# Patient Record
Sex: Female | Born: 1995 | Race: White | Hispanic: No | Marital: Single | Smoking: Never smoker
Health system: Southern US, Community
[De-identification: ages and names within clinical notes are randomized; demographics above are authoritative.]

## PROBLEM LIST (undated history)

## (undated) DIAGNOSIS — F329 Major depressive disorder, single episode, unspecified: Secondary | ICD-10-CM

## (undated) DIAGNOSIS — F32A Depression, unspecified: Secondary | ICD-10-CM

## (undated) DIAGNOSIS — J45909 Unspecified asthma, uncomplicated: Secondary | ICD-10-CM

## (undated) DIAGNOSIS — D649 Anemia, unspecified: Secondary | ICD-10-CM

## (undated) DIAGNOSIS — F419 Anxiety disorder, unspecified: Secondary | ICD-10-CM

## (undated) HISTORY — PX: NO PAST SURGERIES: SHX2092

## (undated) HISTORY — PX: OVARY SURGERY: SHX727

---
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
## 2014-05-22 ENCOUNTER — Encounter (HOSPITAL_COMMUNITY): Payer: Self-pay | Admitting: Emergency Medicine

## 2014-05-22 ENCOUNTER — Emergency Department (HOSPITAL_COMMUNITY): Payer: Medicaid Other

## 2014-05-22 ENCOUNTER — Emergency Department (HOSPITAL_COMMUNITY)
Admission: EM | Admit: 2014-05-22 | Discharge: 2014-05-23 | Disposition: A | Discharge status: Home / Self Care | Payer: Medicaid Other | Attending: Emergency Medicine | Admitting: Emergency Medicine

## 2014-05-22 DIAGNOSIS — N8320 Unspecified ovarian cysts: Secondary | ICD-10-CM | POA: Diagnosis not present

## 2014-05-22 DIAGNOSIS — Z880 Allergy status to penicillin: Secondary | ICD-10-CM | POA: Insufficient documentation

## 2014-05-22 DIAGNOSIS — Z3202 Encounter for pregnancy test, result negative: Secondary | ICD-10-CM | POA: Diagnosis not present

## 2014-05-22 DIAGNOSIS — N83209 Unspecified ovarian cyst, unspecified side: Secondary | ICD-10-CM

## 2014-05-22 DIAGNOSIS — J45909 Unspecified asthma, uncomplicated: Secondary | ICD-10-CM | POA: Insufficient documentation

## 2014-05-22 DIAGNOSIS — R102 Pelvic and perineal pain: Secondary | ICD-10-CM | POA: Diagnosis present

## 2014-05-22 HISTORY — DX: Unspecified asthma, uncomplicated: J45.909

## 2014-05-22 LAB — COMPREHENSIVE METABOLIC PANEL
ALT: 11 U/L (ref 0–35)
ANION GAP: 13 (ref 5–15)
AST: 17 U/L (ref 0–37)
Albumin: 4 g/dL (ref 3.5–5.2)
Alkaline Phosphatase: 55 U/L (ref 39–117)
BUN: 11 mg/dL (ref 6–23)
CO2: 24 meq/L (ref 19–32)
Calcium: 9 mg/dL (ref 8.4–10.5)
Chloride: 103 mEq/L (ref 96–112)
Creatinine, Ser: 0.82 mg/dL (ref 0.50–1.10)
GFR calc Af Amer: >90 mL/min (ref >90)
GFR calc non Af Amer: >90 mL/min (ref >90)
GLUCOSE: 95 mg/dL (ref 70–99)
Potassium: 3.6 mEq/L — ABNORMAL LOW (ref 3.7–5.3)
SODIUM: 140 meq/L (ref 137–147)
Total Bilirubin: 0.2 mg/dL — ABNORMAL LOW (ref 0.3–1.2)
Total Protein: 7.6 g/dL (ref 6.0–8.3)

## 2014-05-22 LAB — WET PREP, GENITAL
Trich, Wet Prep: NONE SEEN
YEAST WET PREP: NONE SEEN

## 2014-05-22 LAB — CBC WITH DIFFERENTIAL/PLATELET
Basophils Absolute: 0 10*3/uL (ref 0.0–0.1)
Basophils Relative: 0 % (ref 0–1)
EOS PCT: 2 % (ref 0–5)
Eosinophils Absolute: 0.2 10*3/uL (ref 0.0–0.7)
HCT: 33.4 % — ABNORMAL LOW (ref 36.0–46.0)
Hemoglobin: 11.3 g/dL — ABNORMAL LOW (ref 12.0–15.0)
LYMPHS ABS: 2.2 10*3/uL (ref 0.7–4.0)
LYMPHS PCT: 26 % (ref 12–46)
MCH: 28.4 pg (ref 26.0–34.0)
MCHC: 33.8 g/dL (ref 30.0–36.0)
MCV: 83.9 fL (ref 78.0–100.0)
Monocytes Absolute: 0.4 10*3/uL (ref 0.1–1.0)
Monocytes Relative: 4 % (ref 3–12)
Neutro Abs: 5.9 10*3/uL (ref 1.7–7.7)
Neutrophils Relative %: 68 % (ref 43–77)
Platelets: 235 10*3/uL (ref 150–400)
RBC: 3.98 MIL/uL (ref 3.87–5.11)
RDW: 14.1 % (ref 11.5–15.5)
WBC: 8.7 10*3/uL (ref 4.0–10.5)

## 2014-05-22 LAB — URINALYSIS, ROUTINE W REFLEX MICROSCOPIC
Bilirubin Urine: NEGATIVE
Glucose, UA: NEGATIVE mg/dL
Hgb urine dipstick: NEGATIVE
Ketones, ur: NEGATIVE mg/dL
LEUKOCYTES UA: NEGATIVE
NITRITE: NEGATIVE
PH: 6 (ref 5.0–8.0)
Protein, ur: NEGATIVE mg/dL
SPECIFIC GRAVITY, URINE: 1.025 (ref 1.005–1.030)
UROBILINOGEN UA: 1 mg/dL (ref 0.0–1.0)

## 2014-05-22 LAB — PREGNANCY, URINE: PREG TEST UR: NEGATIVE

## 2014-05-22 MED ORDER — AZITHROMYCIN 250 MG PO TABS
1000.0000 mg | ORAL_TABLET | Freq: Once | ORAL | Status: AC
  Administered 2014-05-22: 1000 mg via ORAL
  Filled 2014-05-22: qty 4

## 2014-05-22 MED ORDER — CEFTRIAXONE SODIUM 250 MG IJ SOLR
250.0000 mg | Freq: Once | INTRAMUSCULAR | Status: AC
  Administered 2014-05-22: 250 mg via INTRAMUSCULAR
  Filled 2014-05-22: qty 250

## 2014-05-22 MED ORDER — NAPROXEN 250 MG PO TABS
500.0000 mg | ORAL_TABLET | Freq: Once | ORAL | Status: AC
  Administered 2014-05-22: 500 mg via ORAL
  Filled 2014-05-22: qty 2

## 2014-05-22 NOTE — ED Notes (Signed)
Pt. reports hypogastric pain onset this evening , denies nausea or vomitting , no diarrhea or fever. No urinary discomfort.

## 2014-05-22 NOTE — ED Provider Notes (Signed)
CSN: [id number]     Arrival date & time 05/22/14  1944 History   First MD Initiated Contact with Patient 05/22/14 2023     Chief Complaint  Patient presents with  . Abdominal Pain     (Consider location/radiation/quality/duration/timing/severity/associated sxs/prior Treatment) HPI  18 year old G2 P2 female presents complaining of abdominal pain. Patient reported onset of sharp pain to the pelvic region approximately 2 hours ago while she was sitting. Pain is rated as a 7/10, worsened with movement and associated with mild nausea but without vomiting. No associated fever, chills, chest pain, shortness of breath, productive cough, back pain, dysuria, hematuria, hematochezia or melena. Denies any vaginal bleeding or vaginal discharge. Denies any pain with sexual activities. Patient has a Mirena IUD placed in April after she gave birth. Patient states "I was forced to have the IUD". Last sexual activity was a week ago  Past Medical History  Diagnosis Date  . Asthma    History reviewed. No pertinent past surgical history. No family history on file. History  Substance Use Topics  . Smoking status: Never Smoker   . Smokeless tobacco: Not on file  . Alcohol Use: No   OB History   Grav Para Term Preterm Abortions TAB SAB Ect Mult Living                 Review of Systems  Constitutional: Negative for fever.  Gastrointestinal: Positive for abdominal pain.  Genitourinary: Positive for pelvic pain. Negative for dysuria and flank pain.  All other systems reviewed and are negative.     Allergies  Amoxicillin and Penicillins  Home Medications   Prior to Admission medications   Not on File   BP 114/63  Pulse 97  Temp(Src) 98.6 F (37 C) (Oral)  Resp 14  Ht 5\' 2"  (1.575 m)  Wt 151 lb (68.493 kg)  BMI 27.61 kg/m2  SpO2 100% Physical Exam  Nursing note and vitals reviewed. Constitutional: She appears well-developed and well-nourished. No distress.  HENT:  Head: Normocephalic  and atraumatic.  Eyes: Conjunctivae are normal.  Neck: Normal range of motion. Neck supple.  Cardiovascular: Normal rate and regular rhythm.   Pulmonary/Chest: Effort normal and breath sounds normal. She exhibits no tenderness.  Abdominal: Soft. There is tenderness (suprapubic tenderness without guarding or rebound).  Genitourinary: Uterus normal.    There is no rash or lesion on the right labia. There is no rash or lesion on the left labia. Cervix exhibits motion tenderness. Cervix exhibits no discharge and no friability. Right adnexum displays tenderness. Right adnexum displays no mass. Left adnexum displays tenderness. Left adnexum displays no mass. No erythema, tenderness or bleeding around the vagina. Vaginal discharge found.  Chaperone present:  Lymphadenopathy:       Right: No inguinal adenopathy present.       Left: No inguinal adenopathy present.    ED Course  Procedures (including critical care time)  Patient presents with pelvic pain concerning for IUD misplacement. On pelvic examination, IUD string is in place. Patient does have vaginal discharge. Complaining of pain both left, and right adnexa and cervical motion tenderness. Will obtain pelvic ultrasound to rule out torsion, abscess, placement of IUD, and ensure no TOA. Patient otherwise nontoxic, appears to be in no acute distress.  12:04 AM Pelvic US with finding suggestive of a ruptured ovarian cyst, PID less likely.  Pt did receive rocephin/zithromax in ER.  Her complaint is suggestive of ruptured ovarian cyst.  GC/Ch culture sent.  IUD is in  place.  Pt will be d/c with NSAIDs, return precaution given.  Pt comfortable, stable for discharge.    Labs Review Labs Reviewed  WET PREP, GENITAL - Abnormal; Notable for the following:    Clue Cells Wet Prep HPF POC RARE (*)    WBC, Wet Prep HPF POC MODERATE (*)    All other components within normal limits  CBC WITH DIFFERENTIAL - Abnormal; Notable for the following:     Hemoglobin 11.3 (*)    HCT 33.4 (*)    All other components within normal limits  COMPREHENSIVE METABOLIC PANEL - Abnormal; Notable for the following:    Potassium 3.6 (*)    Total Bilirubin 0.2 (*)    All other components within normal limits  GC/CHLAMYDIA PROBE AMP  PREGNANCY, URINE  URINALYSIS, ROUTINE W REFLEX MICROSCOPIC    Imaging Review US Transvaginal Non-ob  05/23/2014   CLINICAL DATA:  Acute Pelvic pain and IUD. Evaluate for IUD displacement or torsion  EXAM: TRANSABDOMINAL AND TRANSVAGINAL ULTRASOUND OF PELVIS  DOPPLER ULTRASOUND OF OVARIES  TECHNIQUE: Both transabdominal and transvaginal ultrasound examinations of the pelvis were performed. Transabdominal technique was performed for global imaging of the pelvis including uterus, ovaries, adnexal regions, and pelvic cul-de-sac.  It was necessary to proceed with endovaginal exam following the transabdominal exam to visualize the ovaries and endometrium. Color and duplex Doppler ultrasound was utilized to evaluate blood flow to the ovaries.  COMPARISON:  None.  FINDINGS: Uterus  Measurements: 10 x 5 x 7 cm. No fibroids or other mass visualized.  Endometrium  Thickness: An IUD is visible. No evidence of myometrial perforation. No serosal perforation. No endometrial thickening.  Right ovary  Measurements: 3.7 x 3 x 3 cm. Normal appearance/no adnexal mass.  Left ovary  Measurements: 6 x 4 x 4 cm. Size asymmetry secondary to a 3.1 x 1.4 x 0.9 cm hypoechoic cyst with avid peripheral vascularity, likely a hemorrhagic corpus luteum.  Pulsed Doppler evaluation of both ovaries demonstrates normal low-resistance arterial and venous waveforms.  Other findings  There is a moderate volume of free fluid in the pelvis which contains mid level echoes. This is likely hemorrhage given the left adnexal findings. Complex fluid can also be seen with pelvic inflammatory disease; there is no evidence for hydrosalpinx or abscess however. These results were called by  telephone at the time of interpretation on 05/23/2014 at 12:02 am to Dr. Fayrene Helper , who verbally acknowledged these results.  IMPRESSION: 1. Moderate mildly complex free pelvic fluid, likely hemorrhage from a ruptured corpus luteum on the left. Recommend follow-up examination in 6-12 weeks. 2. IUD in place. 3. No evidence of ovarian torsion.   Electronically Signed   By: Tiburcio Pea M.D.   On: 05/23/2014 00:05   US Pelvis Complete  05/23/2014   CLINICAL DATA:  Acute Pelvic pain and IUD. Evaluate for IUD displacement or torsion  EXAM: TRANSABDOMINAL AND TRANSVAGINAL ULTRASOUND OF PELVIS  DOPPLER ULTRASOUND OF OVARIES  TECHNIQUE: Both transabdominal and transvaginal ultrasound examinations of the pelvis were performed. Transabdominal technique was performed for global imaging of the pelvis including uterus, ovaries, adnexal regions, and pelvic cul-de-sac.  It was necessary to proceed with endovaginal exam following the transabdominal exam to visualize the ovaries and endometrium. Color and duplex Doppler ultrasound was utilized to evaluate blood flow to the ovaries.  COMPARISON:  None.  FINDINGS: Uterus  Measurements: 10 x 5 x 7 cm. No fibroids or other mass visualized.  Endometrium  Thickness: An IUD is  visible. No evidence of myometrial perforation. No serosal perforation. No endometrial thickening.  Right ovary  Measurements: 3.7 x 3 x 3 cm. Normal appearance/no adnexal mass.  Left ovary  Measurements: 6 x 4 x 4 cm. Size asymmetry secondary to a 3.1 x 1.4 x 0.9 cm hypoechoic cyst with avid peripheral vascularity, likely a hemorrhagic corpus luteum.  Pulsed Doppler evaluation of both ovaries demonstrates normal low-resistance arterial and venous waveforms.  Other findings  There is a moderate volume of free fluid in the pelvis which contains mid level echoes. This is likely hemorrhage given the left adnexal findings. Complex fluid can also be seen with pelvic inflammatory disease; there is no evidence for  hydrosalpinx or abscess however. These results were called by telephone at the time of interpretation on 05/23/2014 at 12:02 am to Dr. Fayrene HelperBOWIE Chabeli Barsamian , who verbally acknowledged these results.  IMPRESSION: 1. Moderate mildly complex free pelvic fluid, likely hemorrhage from a ruptured corpus luteum on the left. Recommend follow-up examination in 6-12 weeks. 2. IUD in place. 3. No evidence of ovarian torsion.   Electronically Signed   By: Tiburcio PeaJonathan  Watts M.D.   On: 05/23/2014 00:05   [place] Art/ven Flow Abd Pelv Doppler  05/23/2014   CLINICAL DATA:  Acute Pelvic pain and IUD. Evaluate for IUD displacement or torsion  EXAM: TRANSABDOMINAL AND TRANSVAGINAL ULTRASOUND OF PELVIS  DOPPLER ULTRASOUND OF OVARIES  TECHNIQUE: Both transabdominal and transvaginal ultrasound examinations of the pelvis were performed. Transabdominal technique was performed for global imaging of the pelvis including uterus, ovaries, adnexal regions, and pelvic cul-de-sac.  It was necessary to proceed with endovaginal exam following the transabdominal exam to visualize the ovaries and endometrium. Color and duplex Doppler ultrasound was utilized to evaluate blood flow to the ovaries.  COMPARISON:  None.  FINDINGS: Uterus  Measurements: 10 x 5 x 7 cm. No fibroids or other mass visualized.  Endometrium  Thickness: An IUD is visible. No evidence of myometrial perforation. No serosal perforation. No endometrial thickening.  Right ovary  Measurements: 3.7 x 3 x 3 cm. Normal appearance/no adnexal mass.  Left ovary  Measurements: 6 x 4 x 4 cm. Size asymmetry secondary to a 3.1 x 1.4 x 0.9 cm hypoechoic cyst with avid peripheral vascularity, likely a hemorrhagic corpus luteum.  Pulsed Doppler evaluation of both ovaries demonstrates normal low-resistance arterial and venous waveforms.  Other findings  There is a moderate volume of free fluid in the pelvis which contains mid level echoes. This is likely hemorrhage given the left adnexal findings. Complex fluid  can also be seen with pelvic inflammatory disease; there is no evidence for hydrosalpinx or abscess however. These results were called by telephone at the time of interpretation on 05/23/2014 at 12:02 am to Dr. Fayrene HelperBOWIE Jerzi Tigert , who verbally acknowledged these results.  IMPRESSION: 1. Moderate mildly complex free pelvic fluid, likely hemorrhage from a ruptured corpus luteum on the left. Recommend follow-up examination in 6-12 weeks. 2. IUD in place. 3. No evidence of ovarian torsion.   Electronically Signed   By: Tiburcio PeaJonathan  Watts M.D.   On: 05/23/2014 00:05     EKG Interpretation None      MDM   Final diagnoses:  Ovarian cyst rupture    BP 104/54  Pulse 70  Temp(Src) 98.6 F (37 C) (Oral)  Resp 14  Ht 5\' 2"  (1.575 m)  Wt 151 lb (68.493 kg)  BMI 27.61 kg/m2  SpO2 98%  I have reviewed nursing notes and vital signs. I personally  reviewed the imaging tests through PACS system  I reviewed available ER/hospitalization records thought the EMR     Fayrene Helper, [place] 05/23/14 0036

## 2014-05-22 NOTE — ED Notes (Signed)
Pelvic cart set up at bedside  

## 2014-05-23 LAB — GC/CHLAMYDIA PROBE AMP
CT PROBE, AMP APTIMA: NEGATIVE
GC PROBE AMP APTIMA: NEGATIVE

## 2014-05-23 MED ORDER — HYDROCODONE-ACETAMINOPHEN 5-325 MG PO TABS: 1.0000 | ORAL_TABLET | Freq: Four times a day (QID) | ORAL | Status: DC | PRN

## 2014-05-23 MED ORDER — KETOROLAC TROMETHAMINE 30 MG/ML IJ SOLN: 30.0000 mg | Freq: Once | INTRAMUSCULAR | Status: DC

## 2014-05-23 MED ORDER — NAPROXEN 500 MG PO TABS: 500.0000 mg | ORAL_TABLET | Freq: Two times a day (BID) | ORAL | Status: DC

## 2014-05-23 NOTE — ED Provider Notes (Signed)
Medical screening examination/treatment/procedure(s) were performed by non-physician practitioner and as supervising physician I was immediately available for consultation/collaboration.  Toy CookeyMegan Docherty, MD 05/23/14 [phone]

## 2014-05-23 NOTE — Discharge Instructions (Signed)
Your pain is likely from a ruptured ovarian cyst.  Take naproxen twice daily for pain.  Take vicodin if pain is severe.  Follow up closely with your OBGYN as needed.  Return if your pain is severe.   Ovarian Cyst An ovarian cyst is a fluid-filled sac that forms on an ovary. The ovaries are small organs that produce eggs in women. Various types of cysts can form on the ovaries. Most are not cancerous. Many do not cause problems, and they often go away on their own. Some may cause symptoms and require treatment. Common types of ovarian cysts include:  Functional cysts--These cysts may occur every month during the menstrual cycle. This is normal. The cysts usually go away with the next menstrual cycle if the woman does not get pregnant. Usually, there are no symptoms with a functional cyst.  Endometrioma cysts--These cysts form from the tissue that lines the uterus. They are also called "chocolate cysts" because they become filled with blood that turns brown. This type of cyst can cause pain in the lower abdomen during intercourse and with your menstrual period.  Cystadenoma cysts--This type develops from the cells on the outside of the ovary. These cysts can get very big and cause lower abdomen pain and pain with intercourse. This type of cyst can twist on itself, cut off its blood supply, and cause severe pain. It can also easily rupture and cause a lot of pain.  Dermoid cysts--This type of cyst is sometimes found in both ovaries. These cysts may contain different kinds of body tissue, such as skin, teeth, hair, or cartilage. They usually do not cause symptoms unless they get very big.  Theca lutein cysts--These cysts occur when too much of a certain hormone (human chorionic gonadotropin) is produced and overstimulates the ovaries to produce an egg. This is most common after procedures used to assist with the conception of a baby (in vitro fertilization). CAUSES   Fertility drugs can cause a condition  in which multiple large cysts are formed on the ovaries. This is called ovarian hyperstimulation syndrome.  A condition called polycystic ovary syndrome can cause hormonal imbalances that can lead to nonfunctional ovarian cysts. SIGNS AND SYMPTOMS  Many ovarian cysts do not cause symptoms. If symptoms are present, they may include:  Pelvic pain or pressure.  Pain in the lower abdomen.  Pain during sexual intercourse.  Increasing girth (swelling) of the abdomen.  Abnormal menstrual periods.  Increasing pain with menstrual periods.  Stopping having menstrual periods without being pregnant. DIAGNOSIS  These cysts are commonly found during a routine or annual pelvic exam. Tests may be ordered to find out more about the cyst. These tests may include:  Ultrasound.  X-ray of the pelvis.  CT scan.  MRI.  Blood tests. TREATMENT  Many ovarian cysts go away on their own without treatment. Your health care provider may want to check your cyst regularly for 2-3 months to see if it changes. For women in menopause, it is particularly important to monitor a cyst closely because of the higher rate of ovarian cancer in menopausal women. When treatment is needed, it may include any of the following:  A procedure to drain the cyst (aspiration). This may be done using a long needle and ultrasound. It can also be done through a laparoscopic procedure. This involves using a thin, lighted tube with a tiny camera on the end (laparoscope) inserted through a small incision.  Surgery to remove the whole cyst. This may be  done using laparoscopic surgery or an open surgery involving a larger incision in the lower abdomen.  Hormone treatment or birth control pills. These methods are sometimes used to help dissolve a cyst. HOME CARE INSTRUCTIONS   Only take over-the-counter or prescription medicines as directed by your health care provider.  Follow up with your health care provider as directed.  Get  regular pelvic exams and Pap tests. SEEK MEDICAL CARE IF:   Your periods are late, irregular, or painful, or they stop.  Your pelvic pain or abdominal pain does not go away.  Your abdomen becomes larger or swollen.  You have pressure on your bladder or trouble emptying your bladder completely.  You have pain during sexual intercourse.  You have feelings of fullness, pressure, or discomfort in your stomach.  You lose weight for no apparent reason.  You feel generally ill.  You become constipated.  You lose your appetite.  You develop acne.  You have an increase in body and facial hair.  You are gaining weight, without changing your exercise and eating habits.  You think you are pregnant. SEEK IMMEDIATE MEDICAL CARE IF:   You have increasing abdominal pain.  You feel sick to your stomach (nauseous), and you throw up (vomit).  You develop a fever that comes on suddenly.  You have abdominal pain during a bowel movement.  Your menstrual periods become heavier than usual. MAKE SURE YOU:  Understand these instructions.  Will watch your condition.  Will get help right away if you are not doing well or get worse. Document Released: 07/24/2005 Document Revised: 07/29/2013 Document Reviewed: 03/31/2013 [facility] Patient Information 2015 [place], [place]. This information is not intended to replace advice given to you by your health care provider. Make sure you discuss any questions you have with your health care provider.
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
## 2014-12-15 NOTE — H&P (Signed)
L&D Evaluation:  History:   H[facility] 19 y/[facility] 2 brief variable decelerations (90's x 30/45 seconds) with (reactive) NST for size less than dates/slow growth. Recent US (+ small growth nl AFI) Denies contractions, leaking fluid or vaginal bleeding. Teen pregnancy, asthma, late pnc.    Presents with above    Patient's Medical History Asthma    Patient's Surgical History none    Medications Pre [place]atal Vitamins  rare albuterol use    Allergies PCN    Social History none    Family History Non-Contributory   ROS:   ROS All systems were reviewed.  HEENT, CNS, GI, GU, Respiratory, CV, Renal and Musculoskeletal systems were found to be normal.   Exam:   Vital Signs stable    Urine Protein not completed    General no apparent distress    Mental Status clear    Chest clear    Heart normal sinus rhythm    Abdomen gravid, non-tender    Estimated Fetal Weight Small for gestational age    Fetal Position vtx    Fundal Height S<D    Back no CVAT    Edema no edema    Reflexes 1+    Clonus negative    Pelvic no external lesions, SVE per C Jones @ [facility] office VTX cx closed softening    Mebranes Intact    FHT normal rate with no decels, baseline 120's 130's avg variability with multiple accels    Fetal Heart Rate 136    Ucx occasional uc    Skin dry    Lymph no lymphadenopathy   Impression:   Impression Term SGA schedule IOL   Plan:   Plan EFM/NST, monitor contractions and for cervical change    Comments Reassured EFM reactive, baby active.Recommend IOL with cervical ripening. Explained to pt and Mom what to expect with IOL, plan of care. Multiple questions answered.Will dc to home with precautions, knows kick counts, when to call office when to call L&D.   Electronic Signatures: Albertina ParrLugiano, Christobal Morado B (CNM)  (Signed 17-Dec-13 18:12)  Authored: L&D Evaluation   Last Updated: 17-Dec-13 18:12 by Albertina ParrLugiano, Krystn Dermody B (CNM)

## 2016-08-14 ENCOUNTER — Emergency Department (HOSPITAL_COMMUNITY)
Admission: EM | Admit: 2016-08-14 | Discharge: 2016-08-14 | Disposition: A | Discharge status: Home / Self Care | Payer: Medicaid Other | Attending: Dermatology | Admitting: Dermatology

## 2016-08-14 ENCOUNTER — Encounter (HOSPITAL_COMMUNITY): Payer: Self-pay | Admitting: Emergency Medicine

## 2016-08-14 DIAGNOSIS — Z5321 Procedure and treatment not carried out due to patient leaving prior to being seen by health care provider: Secondary | ICD-10-CM | POA: Diagnosis not present

## 2016-08-14 DIAGNOSIS — W25XXXA Contact with sharp glass, initial encounter: Secondary | ICD-10-CM | POA: Insufficient documentation

## 2016-08-14 DIAGNOSIS — Y939 Activity, unspecified: Secondary | ICD-10-CM | POA: Diagnosis not present

## 2016-08-14 DIAGNOSIS — Y929 Unspecified place or not applicable: Secondary | ICD-10-CM | POA: Insufficient documentation

## 2016-08-14 DIAGNOSIS — S91311A Laceration without foreign body, right foot, initial encounter: Secondary | ICD-10-CM | POA: Insufficient documentation

## 2016-08-14 DIAGNOSIS — Y999 Unspecified external cause status: Secondary | ICD-10-CM | POA: Diagnosis not present
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
## 2016-08-14 NOTE — ED Notes (Signed)
Pt called to be moved to a room x3 with no answer.

## 2016-08-14 NOTE — ED Notes (Signed)
Pt called in waiting and no response

## 2017-03-22 ENCOUNTER — Encounter: Payer: Self-pay | Admitting: *Deleted

## 2017-03-22 ENCOUNTER — Ambulatory Visit
Admission: EM | Admit: 2017-03-22 | Discharge: 2017-03-22 | Disposition: A | Discharge status: Home / Self Care | Payer: Medicaid Other | Attending: Internal Medicine | Admitting: Internal Medicine

## 2017-03-22 DIAGNOSIS — J039 Acute tonsillitis, unspecified: Secondary | ICD-10-CM

## 2017-03-22 DIAGNOSIS — J020 Streptococcal pharyngitis: Secondary | ICD-10-CM | POA: Diagnosis not present

## 2017-03-22 DIAGNOSIS — J029 Acute pharyngitis, unspecified: Secondary | ICD-10-CM | POA: Diagnosis present

## 2017-03-22 LAB — RAPID STREP SCREEN (MED CTR MEBANE ONLY): STREPTOCOCCUS, GROUP A SCREEN (DIRECT): POSITIVE — AB

## 2017-03-22 MED ORDER — AZITHROMYCIN 250 MG PO TABS: ORAL_TABLET | ORAL | 0 refills | Status: DC

## 2017-03-22 MED ORDER — DEXAMETHASONE SODIUM PHOSPHATE 10 MG/ML IJ SOLN
10.0000 mg | Freq: Once | INTRAMUSCULAR | Status: AC
  Administered 2017-03-22: 10 mg via INTRAMUSCULAR

## 2017-03-22 NOTE — ED Provider Notes (Addendum)
[facility] ____________________________________________  Time seen: Approximately 6:10 PM  I have reviewed the triage vital signs and the nursing notes.   HISTORY  Chief Complaint Sore Throat; Otalgia; Chills; and Generalized Body Aches   HPI Sharon Roberts is a 21 y.o. female presenting for evaluation of sore throat, bilateral ear discomfort, body aches and chills that was present upon awakening this morning. Patient reports body aches are generalized and did take 600 mg ibuprofen over-the-counter at lunchtime today. Has not take any other over-the-counter medications. States sore throat is currently moderate. States has continued to drink fluids, but not eating as much due to pain with swallowing. States sore throat pain has worsened throughout the day. Patient states overall she doesn't feel good which is making her feel somewhat anxious and makes her feel like she is breathing fast. Denies known fevers or known sick contacts. 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 Denies accompanying runny nose, nasal congestion or cough. Denies recent sickness or recent antibiotic use. Denies other alleviating measures attempted. Denies other aggravating or alleviating factors. States has had strep throat multiple times in past with similar presentation.  Denies chest pain, abdominal pain, dysuria, extremity pain, extremity swelling or rash. Denies recent sickness. Denies recent antibiotic use. States Is allergic to penicillin and states she has nausea and vomiting and reports per her mother says shortness of breath too.  No LMP recorded. Patient is not currently having periods (Reason: IUD). Denies pregnancy.  [facility]: PCP   Past Medical History:  Diagnosis Date  . Asthma     There are no active problems to display for this patient.   History reviewed. No pertinent surgical history.   No current  facility-administered medications for this encounter.   Current Outpatient Prescriptions:  .  levonorgestrel (MIRENA) 20 MCG/24HR IUD, 1 each by Intrauterine route once., Disp: , Rfl:  .  azithromycin (ZITHROMAX Z-PAK) 250 MG tablet, Take 2 tablets (500 mg) on  Day 1,  followed by 1 tablet (250 mg) once daily on Days 2 through 5., Disp: 6 each, Rfl: 0 .  HYDROcodone-acetaminophen (NORCO/VICODIN) 5-325 MG per tablet, Take 1 tablet by mouth every 6 (six) hours as needed for moderate pain or severe pain., Disp: 6 tablet, Rfl: 0 .  naproxen (NAPROSYN) 500 MG tablet, Take 1 tablet (500 mg total) by mouth 2 (two) times daily., Disp: 30 tablet, Rfl: 0  Allergies Amoxicillin and Penicillins  History reviewed. No pertinent family history.  Social History Social History  Substance Use Topics  . Smoking status: Never Smoker  . Smokeless tobacco: Never Used  . Alcohol use No    Review of Systems Constitutional: As above. Eyes: No visual changes. ENT: positive sore throat. Cardiovascular: Denies chest pain. Respiratory: Denies shortness of breath. Gastrointestinal: No abdominal pain.  No nausea, no vomiting.  No diarrhea.  No constipation. Genitourinary: Negative for dysuria. Musculoskeletal: Negative for back pain. Skin: Negative for rash.   ____________________________________________   PHYSICAL EXAM:  VITAL SIGNS: ED Triage Vitals  Enc Vitals Group     BP 03/22/17 1736 110/68     Pulse Rate 03/22/17 1736 (!) 110     Resp 03/22/17 1736 16     Temp 03/22/17 1736 99.1 F (37.3 C)     Temp Source 03/22/17 1736 Oral     SpO2 03/22/17 1736 100 %     Weight 03/22/17 1738 152 lb (68.9 kg)     Height  03/22/17 1738 5\' 4"  (1.626 m)     Head Circumference --      Peak Flow --      Pain Score 03/22/17 1824 0     Pain Loc --      Pain Edu? --      Excl. in GC? --     Constitutional: Alert and oriented. Well appearing and in no acute distress. Eyes: Conjunctivae are normal.  Head:  Atraumatic. No sinus tenderness to palpation. No swelling. No erythema.  Ears: no erythema, normal TMs bilaterally. No surrounding tenderness or erythema bilaterally.  Nose:No nasal congestion or rhinorrhea.  Mouth/Throat: Mucous membranes are moist. Moderate pharyngeal erythema with 2+ bilateral tonsillar swelling and bilateral exudate. No uvular shift or deviation.  Neck: No stridor.  No cervical spine tenderness to palpation. Hematological/Lymphatic/Immunilogical: Bilateral anterior cervical lymphadenopathy. Cardiovascular: Normal rate, regular rhythm. Grossly normal heart sounds.  Good peripheral circulation. Respiratory: Normal respiratory effort.  No retractions. No wheezes, rales or rhonchi. Good air movement.  Gastrointestinal: Soft and nontender. No CVA tenderness. No hepatosplenomegaly palpated.  Musculoskeletal: Ambulatory with steady gait. No cervical, thoracic or lumbar tenderness to palpation. Neurologic:  Normal speech and language. No gait instability. Skin:  Skin appears warm, dry and intact. No rash noted. Psychiatric: Mood and affect are normal. Speech and behavior are normal.  ___________________________________________   LABS (all labs ordered are listed, but only abnormal results are displayed)  Labs Reviewed  RAPID STREP SCREEN (NOT AT [facility]) - Abnormal; Notable for the following:       Result Value   Streptococcus, Group A Screen (Direct) POSITIVE (*)    All other components within normal limits   ____________________________________________ PROCEDURES Procedures    INITIAL IMPRESSION / ASSESSMENT AND PLAN / ED COURSE  Pertinent labs & imaging results that were available during my care of the patient were reviewed by me and considered in my medical decision making (see chart for details).  Well-appearing patient. Suspect streptococcal pharyngitis. Quick strep positive. 10 mg IM Decadron given once in urgent care. After Decadron in urgent care, patient reports  she is feeling much better. States she is no longer feeling anxious and breathing has calmed and only now with sore throat. Will treat patient as penicillin allergic, with oral azithromycin. Encouraged rest, fluids, supportive care with over-the-counter Tylenol or ibuprofen. Encouraged fluids and food. Discussed strict follow-up and return parameters.  Discussed follow up with Primary care physician this week as needed. Discussed follow up and return parameters including no resolution or any worsening concerns. Patient verbalized understanding and agreed to plan.   ____________________________________________   FINAL CLINICAL IMPRESSION(S) / ED DIAGNOSES  Final diagnoses:  Strep pharyngitis  Exudative tonsillitis     Discharge Medication List as of 03/22/2017  6:23 PM    START taking these medications   Details  azithromycin (ZITHROMAX Z-PAK) 250 MG tablet Take 2 tablets (500 mg) on  Day 1,  followed by 1 tablet (250 mg) once daily on Days 2 through 5., Normal        Note: This dictation was prepared with Dragon dictation along with smaller phrase technology. Any transcriptional errors that result from this process are unintentional.         Renford Dills, NP 03/22/17 2001    Renford Dills, NP 03/22/17 2004

## 2017-03-22 NOTE — Discharge Instructions (Signed)
Take medication as prescribed. Rest. Drink plenty of fluids.  ° °Follow up with your primary care physician this week as needed. Return to Urgent care for new or worsening concerns.  ° °

## 2017-03-22 NOTE — ED Triage Notes (Signed)
Awoke this morning with sore throat, chills, ear pain, and body aches. Has become worse through day.

## 2019-04-10 DIAGNOSIS — Z3480 Encounter for supervision of other normal pregnancy, unspecified trimester: Secondary | ICD-10-CM | POA: Insufficient documentation

## 2019-04-17 LAB — OB RESULTS CONSOLE HEPATITIS B SURFACE ANTIGEN: Hepatitis B Surface Ag: NEGATIVE

## 2019-04-17 LAB — OB RESULTS CONSOLE VARICELLA ZOSTER ANTIBODY, IGG: Varicella: IMMUNE

## 2019-04-17 LAB — OB RESULTS CONSOLE RUBELLA ANTIBODY, IGM: Rubella: IMMUNE

## 2019-08-04 ENCOUNTER — Ambulatory Visit: Payer: Managed Care, Other (non HMO) | Admitting: Physical Therapy

## 2019-08-08 NOTE — L&D Delivery Note (Signed)
Delivery Note  Date of delivery: 11/03/2019 Estimated Date of Delivery: 11/07/19 Patient's last menstrual period was 02/07/2019. EGA: [redacted]w[redacted]d  Delivery Note At 5:45 AM a viable female was delivered via Vaginal, Spontaneous (Presentation: OA).  APGAR: , ; weight  pending.   Placenta status: Spontaneous, Intact.  Cord: 3VC  with the following complications: none.    Anesthesia: Epidural Episiotomy: None Lacerations:  sm periurethral Suture Repair: none Est. Blood Loss (mL):  525  Mom to postpartum.  Baby to Couplet care / Skin to Skin.  First Stage: Labor onset: ~0400 Augmentation : none Analgesia /Anesthesia intrapartum: Epidural SROM at [employer] presented to L&D for elective IOL. She was induced with cytotec. Epidural placed.   Second Stage: Complete dilation at 0540 Onset of pushing at 0540 FHR second stage Cat I Delivery at 0545 on 11/03/2019  She progressed to complete and had a spontaneous vaginal birth of a live female over an intact perineum. The fetal head was delivered in OA position with restitution to LOA. Nuchal cord noted. Anterior then posterior shoulders delivered spontaneously with minimal assistance. Baby placed on mom's abdomen and attended to by transition RN. Cord clamped and cut when pulseless by FOB. Cord blood obtained for newborn labs.  Third Stage: Placenta delivered intact with 3VC at 0554 Placenta disposition: dispose Uterine tone firm / bleeding moderate - cytotec given  IV pitocin given for hemorrhage prophylaxis  Small periurethral laceration identified  Anesthesia for repair: epidural Repair none Est. Blood Loss (mL): 525  Complications: none  Newborn: Birth Weight: pending for skin to skin  Apgar Scores: 8/9 Feeding planned: bottle   Sharon Roberts, CNM 11/03/2019 6:06 AM

## 2019-08-11 ENCOUNTER — Ambulatory Visit: Payer: Managed Care, Other (non HMO) | Attending: Certified Nurse Midwife | Admitting: Physical Therapy

## 2019-08-18 ENCOUNTER — Encounter: Payer: Self-pay | Admitting: Physical Therapy

## 2019-08-25 ENCOUNTER — Encounter: Payer: Self-pay | Admitting: Physical Therapy

## 2019-09-01 ENCOUNTER — Encounter: Payer: Self-pay | Admitting: Physical Therapy

## 2019-09-08 ENCOUNTER — Encounter: Payer: Self-pay | Admitting: Physical Therapy

## 2019-09-15 ENCOUNTER — Encounter: Payer: Self-pay | Admitting: Physical Therapy

## 2019-09-22 ENCOUNTER — Encounter: Payer: Self-pay | Admitting: Physical Therapy

## 2019-09-29 ENCOUNTER — Encounter: Payer: Self-pay | Admitting: Physical Therapy

## 2019-10-17 LAB — OB RESULTS CONSOLE HIV ANTIBODY (ROUTINE TESTING): HIV: NONREACTIVE

## 2019-10-17 LAB — OB RESULTS CONSOLE GC/CHLAMYDIA
Chlamydia: NEGATIVE
Gonorrhea: NEGATIVE

## 2019-10-17 LAB — OB RESULTS CONSOLE RPR: RPR: NONREACTIVE

## 2019-10-17 LAB — OB RESULTS CONSOLE GBS: GBS: NEGATIVE

## 2019-10-24 ENCOUNTER — Encounter (INDEPENDENT_AMBULATORY_CARE_PROVIDER_SITE_OTHER): Payer: Self-pay

## 2019-10-24 ENCOUNTER — Other Ambulatory Visit: Payer: Self-pay

## 2019-10-24 ENCOUNTER — Encounter: Payer: Self-pay | Admitting: Oncology

## 2019-10-24 ENCOUNTER — Inpatient Hospital Stay: Payer: 59 | Attending: Oncology | Admitting: Oncology

## 2019-10-24 ENCOUNTER — Ambulatory Visit: Payer: 59

## 2019-10-24 VITALS — BP 112/75 | HR 94 | Temp 97.8°F | Ht 65.0 in | Wt 205.0 lb

## 2019-10-24 DIAGNOSIS — Z8249 Family history of ischemic heart disease and other diseases of the circulatory system: Secondary | ICD-10-CM

## 2019-10-24 DIAGNOSIS — O99013 Anemia complicating pregnancy, third trimester: Secondary | ICD-10-CM | POA: Diagnosis not present

## 2019-10-24 DIAGNOSIS — Z79899 Other long term (current) drug therapy: Secondary | ICD-10-CM | POA: Diagnosis not present

## 2019-10-24 DIAGNOSIS — N912 Amenorrhea, unspecified: Secondary | ICD-10-CM | POA: Insufficient documentation

## 2019-10-24 DIAGNOSIS — D649 Anemia, unspecified: Secondary | ICD-10-CM | POA: Diagnosis not present

## 2019-10-24 DIAGNOSIS — R519 Headache, unspecified: Secondary | ICD-10-CM | POA: Insufficient documentation

## 2019-10-24 DIAGNOSIS — J45909 Unspecified asthma, uncomplicated: Secondary | ICD-10-CM

## 2019-10-24 NOTE — Progress Notes (Signed)
Patient is here today to establish care for anemia. Patient stated that she had been having SOB at times.

## 2019-10-26 ENCOUNTER — Encounter: Payer: Self-pay | Admitting: Obstetrics and Gynecology

## 2019-10-26 ENCOUNTER — Encounter: Payer: Self-pay | Admitting: Certified Nurse Midwife

## 2019-10-26 ENCOUNTER — Other Ambulatory Visit: Payer: Self-pay | Admitting: Certified Nurse Midwife

## 2019-10-26 ENCOUNTER — Observation Stay
Admission: EM | Admit: 2019-10-26 | Discharge: 2019-10-26 | Disposition: A | Discharge status: Home / Self Care | Payer: 59 | Attending: Certified Nurse Midwife | Admitting: Certified Nurse Midwife

## 2019-10-26 ENCOUNTER — Other Ambulatory Visit: Payer: Self-pay

## 2019-10-26 DIAGNOSIS — D649 Anemia, unspecified: Secondary | ICD-10-CM | POA: Insufficient documentation

## 2019-10-26 DIAGNOSIS — Z3A38 38 weeks gestation of pregnancy: Secondary | ICD-10-CM | POA: Diagnosis not present

## 2019-10-26 DIAGNOSIS — O26893 Other specified pregnancy related conditions, third trimester: Principal | ICD-10-CM | POA: Insufficient documentation

## 2019-10-26 DIAGNOSIS — O99013 Anemia complicating pregnancy, third trimester: Secondary | ICD-10-CM | POA: Diagnosis not present

## 2019-10-26 DIAGNOSIS — R519 Headache, unspecified: Secondary | ICD-10-CM | POA: Diagnosis present

## 2019-10-26 DIAGNOSIS — Z880 Allergy status to penicillin: Secondary | ICD-10-CM | POA: Diagnosis not present

## 2019-10-26 DIAGNOSIS — J45909 Unspecified asthma, uncomplicated: Secondary | ICD-10-CM | POA: Insufficient documentation

## 2019-10-26 DIAGNOSIS — O212 Late vomiting of pregnancy: Secondary | ICD-10-CM | POA: Diagnosis not present

## 2019-10-26 DIAGNOSIS — O99513 Diseases of the respiratory system complicating pregnancy, third trimester: Secondary | ICD-10-CM | POA: Insufficient documentation

## 2019-10-26 HISTORY — DX: Anxiety disorder, unspecified: F41.9

## 2019-10-26 HISTORY — DX: Depression, unspecified: F32.A

## 2019-10-26 LAB — URINALYSIS, COMPLETE (UACMP) WITH MICROSCOPIC
Bilirubin Urine: NEGATIVE
Glucose, UA: NEGATIVE mg/dL
Hgb urine dipstick: NEGATIVE
Ketones, ur: NEGATIVE mg/dL
Leukocytes,Ua: NEGATIVE
Nitrite: NEGATIVE
Protein, ur: 30 mg/dL — AB
Specific Gravity, Urine: 1.024 (ref 1.005–1.030)
pH: 5 (ref 5.0–8.0)

## 2019-10-26 LAB — COMPREHENSIVE METABOLIC PANEL
ALT: 12 U/L (ref 0–44)
AST: 17 U/L (ref 15–41)
Albumin: 3.1 g/dL — ABNORMAL LOW (ref 3.5–5.0)
Alkaline Phosphatase: 165 U/L — ABNORMAL HIGH (ref 38–126)
Anion gap: 6 (ref 5–15)
BUN: 7 mg/dL (ref 6–20)
CO2: 20 mmol/L — ABNORMAL LOW (ref 22–32)
Calcium: 8.7 mg/dL — ABNORMAL LOW (ref 8.9–10.3)
Chloride: 109 mmol/L (ref 98–111)
Creatinine, Ser: 0.5 mg/dL (ref 0.44–1.00)
GFR calc Af Amer: >60 mL/min (ref >60)
GFR calc non Af Amer: >60 mL/min (ref >60)
Glucose, Bld: 85 mg/dL (ref 70–99)
Potassium: 3.6 mmol/L (ref 3.5–5.1)
Sodium: 135 mmol/L (ref 135–145)
Total Bilirubin: 0.5 mg/dL (ref 0.3–1.2)
Total Protein: 6.3 g/dL — ABNORMAL LOW (ref 6.5–8.1)

## 2019-10-26 LAB — CBC
HCT: 26.8 % — ABNORMAL LOW (ref 36.0–46.0)
Hemoglobin: 8.5 g/dL — ABNORMAL LOW (ref 12.0–15.0)
MCH: 27.1 pg (ref 26.0–34.0)
MCHC: 31.7 g/dL (ref 30.0–36.0)
MCV: 85.4 fL (ref 80.0–100.0)
Platelets: 200 10*3/uL (ref 150–400)
RBC: 3.14 MIL/uL — ABNORMAL LOW (ref 3.87–5.11)
RDW: 15.3 % (ref 11.5–15.5)
WBC: 7.9 10*3/uL (ref 4.0–10.5)
nRBC: 0 % (ref 0.0–0.2)

## 2019-10-26 LAB — PROTEIN / CREATININE RATIO, URINE
Creatinine, Urine: 223 mg/dL
Protein Creatinine Ratio: 0.04 mg/mg{Cre} (ref 0.00–0.15)
Total Protein, Urine: 8 mg/dL

## 2019-10-26 LAB — LIPASE, BLOOD: Lipase: 33 U/L (ref 11–51)

## 2019-10-26 LAB — AMYLASE: Amylase: 43 U/L (ref 28–100)

## 2019-10-26 MED ORDER — LACTATED RINGERS IV BOLUS
1000.0000 mL | Freq: Once | INTRAVENOUS | Status: AC
  Administered 2019-10-26: 21:00:00 1000 mL via INTRAVENOUS

## 2019-10-26 MED ORDER — ACETAMINOPHEN 500 MG PO TABS: 1000.0000 mg | ORAL_TABLET | Freq: Four times a day (QID) | ORAL | Status: DC | PRN

## 2019-10-26 MED ORDER — ONDANSETRON HCL 4 MG/2ML IJ SOLN: 4.0000 mg | Freq: Three times a day (TID) | INTRAMUSCULAR | Status: DC | PRN

## 2019-10-26 MED ORDER — PROCHLORPERAZINE EDISYLATE 10 MG/2ML IJ SOLN
10.0000 mg | Freq: Once | INTRAMUSCULAR | Status: AC
  Administered 2019-10-26: 10 mg via INTRAVENOUS
  Filled 2019-10-26: qty 2

## 2019-10-26 MED ORDER — ACETAMINOPHEN 500 MG PO TABS
1000.0000 mg | ORAL_TABLET | Freq: Once | ORAL | Status: AC
  Administered 2019-10-26: 21:00:00 1000 mg via ORAL
  Filled 2019-10-26: qty 2

## 2019-10-26 MED ORDER — DIPHENHYDRAMINE HCL 25 MG PO CAPS
25.0000 mg | ORAL_CAPSULE | Freq: Once | ORAL | Status: AC
  Administered 2019-10-26: 21:00:00 25 mg via ORAL
  Filled 2019-10-26: qty 1

## 2019-10-26 MED ORDER — ONDANSETRON 4 MG PO TBDP: 4.0000 mg | ORAL_TABLET | Freq: Three times a day (TID) | ORAL | Status: DC | PRN

## 2019-10-26 NOTE — OB Triage Note (Signed)
Pt is a G3P2 and [redacted]w[redacted]d presenting to L&D with c/o a dull headache that started around 1330 today.  Pt rates pain 8/10 on a 0-10 pain scale and states "this is the worst my head has every hurt, I have not stopped crying." Pt states she tried to rest and took 500mg  of Tylenol but it did not help her pain. Pt states she has blurry vision and white floaters in both eyes that also started around 1330 today as well. Pt states she threw up one time today and it was all liquid. Pt denies epigastric pain. Pt confirms positive fetal movement and denies VB. Vital signs WDL. Monitors applied and assessing.

## 2019-10-26 NOTE — OB Triage Note (Signed)
Discharge instructions reviewed. Pt verbalized understanding and denies any further needs. Pt discharged with fiance.

## 2019-10-26 NOTE — Discharge Summary (Signed)
Sharon Roberts is a 24 y.o. female. She is at [redacted]w[redacted]d gestation. Patient's last menstrual period was 02/07/2019. Estimated Date of Delivery: 11/07/19  Prenatal care site: [facility]  Chief complaint: headache Location: frontal, behind her eyes, worse on right than left Onset/timing: today at 1330 Duration: constant Quality: throbbing, aching Severity: 8/10 Aggravating or alleviating conditions: none Associated signs/symptoms: vomiting x1, feeling hot, blurry vision, intermittent white floaters Context: Sharon Roberts reports that this morning she felt fine. In the afternoon after lunch, she started to feel unwell, feeling hot with a severe headache. She took 500mg  of Tylenol, had some caffeine, and tried to take a nap. Her headache was not any better after these measures.  In addition to the headache, she reports visual changes of blurry vision and floaters that look like white spots in her vision. The blurry vision is more constant, with the floaters intermittent. She took her BP at home and it was 117/71. She reports feeling hot and dizzy. She also reports nausea with one episode of liquid emesis. She has a history of migraines, but she says that this feels different than her headaches usually do.   S: Resting comfortably.   She reports:  -active fetal movement -no leakage of fluid -no vaginal bleeding -no contractions  Maternal Medical History:   Past Medical History:  Diagnosis Date  . Anxiety   . Asthma   . Depression     Past Surgical History:  Procedure Laterality Date  . NO PAST SURGERIES      Allergies  Allergen Reactions  . Amoxicillin Shortness Of Breath and Nausea And Vomiting  . Penicillins Shortness Of Breath and Nausea And Vomiting    Prior to Admission medications   Medication Sig Start Date End Date Taking? Authorizing Provider  albuterol (VENTOLIN HFA) 108 (90 Base) MCG/ACT inhaler Inhale 2 puffs into the lungs as needed. 05/20/18   [provider]  cyclobenzaprine (FLEXERIL) 5 MG tablet Take 1 tablet by mouth 1 day or 1 dose. 10/08/19   [provider]  ferrous sulfate 325 (65 FE) MG EC tablet Take 1 tablet by mouth in the morning and at bedtime. 10/17/19 10/16/20  [provider]  Prenatal 28-0.8 MG TABS Take 1 tablet by mouth daily.    [provider]     Social History: She  reports that she has never smoked. She has never used smokeless tobacco. She reports that she does not drink alcohol or use drugs.  Family History: family history includes COPD in her mother; Fibromyalgia in her mother; Healthy in her brother, sister, and sister; Hypertension in her father; Neuropathy in her mother.   Review of Systems: A full review of systems was performed and negative except as noted in the HPI.     O:  BP 122/65   Pulse 79   Temp 97.8 F (36.6 C) (Oral)   Resp 16   Ht 5\' 4"  (1.626 m)   Wt 93.9 kg   LMP 02/07/2019   BMI 35.53 kg/m  Results for orders placed or performed during the hospital encounter of 10/26/19 (from the past 48 hour(s))  CBC on admission   Collection Time: 10/26/19  8:16 PM  Result Value Ref Range   WBC 7.9 4.0 - 10.5 K/uL   RBC 3.14 (L) 3.87 - 5.11 MIL/uL   Hemoglobin 8.5 (L) 12.0 - 15.0 g/dL   HCT 10/28/19 (L) 10/28/19 - 38.8 %   MCV 85.4 80.0 - 100.0 fL  MCH 27.1 26.0 - 34.0 pg   MCHC 31.7 30.0 - 36.0 g/dL   RDW 15.3 11.5 - 15.5 %   Platelets 200 150 - 400 K/uL   nRBC 0.0 0.0 - 0.2 %  Comprehensive metabolic panel   Collection Time: 10/26/19  8:16 PM  Result Value Ref Range   Sodium 135 135 - 145 mmol/L   Potassium 3.6 3.5 - 5.1 mmol/L   Chloride 109 98 - 111 mmol/L   CO2 20 (L) 22 - 32 mmol/L   Glucose, Bld 85 70 - 99 mg/dL   BUN 7 6 - 20 mg/dL   Creatinine, Ser 0.50 0.44 - 1.00 mg/dL   Calcium 8.7 (L) 8.9 - 10.3 mg/dL   Total Protein 6.3 (L) 6.5 - 8.1 g/dL   Albumin 3.1 (L) 3.5 - 5.0 g/dL   AST 17 15 - 41 U/L   ALT 12 0 - 44 U/L   Alkaline Phosphatase 165 (H)  38 - 126 U/L   Total Bilirubin 0.5 0.3 - 1.2 mg/dL   GFR calc non Af Amer >60 >60 mL/min   GFR calc Af Amer >60 >60 mL/min   Anion gap 6 5 - 15  Lipase, blood   Collection Time: 10/26/19  8:16 PM  Result Value Ref Range   Lipase 33 11 - 51 U/L  Amylase   Collection Time: 10/26/19  8:16 PM  Result Value Ref Range   Amylase 43 28 - 100 U/L  Protein / creatinine ratio, urine   Collection Time: 10/26/19  8:33 PM  Result Value Ref Range   Creatinine, Urine 223 mg/dL   Total Protein, Urine 8 mg/dL   Protein Creatinine Ratio 0.04 0.00 - 0.15 mg/mg[Cre]  Urinalysis, Complete w Microscopic   Collection Time: 10/26/19  8:33 PM  Result Value Ref Range   Color, Urine YELLOW (A) YELLOW   APPearance HAZY (A) CLEAR   Specific Gravity, Urine 1.024 1.005 - 1.030   pH 5.0 5.0 - 8.0   Glucose, UA NEGATIVE NEGATIVE mg/dL   Hgb urine dipstick NEGATIVE NEGATIVE   Bilirubin Urine NEGATIVE NEGATIVE   Ketones, ur NEGATIVE NEGATIVE mg/dL   Protein, ur 30 (A) NEGATIVE mg/dL   Nitrite NEGATIVE NEGATIVE   Leukocytes,Ua NEGATIVE NEGATIVE   RBC / HPF 0-5 0 - 5 RBC/hpf   WBC, UA 0-5 0 - 5 WBC/hpf   Bacteria, UA FEW (A) NONE SEEN   Squamous Epithelial / LPF 0-5 0 - 5   Mucus PRESENT    Ca Oxalate Crys, UA PRESENT      Constitutional: NAD, AAOx3  HE/ENT: extraocular movements grossly intact, moist mucous membranes CV: RRR PULM: normal respiratory effort, CTABL     Abd: gravid, non-tender, non-distended, soft      Ext: Non-tender, +1 b/l lower extremity edema, no clonus   Psych: mood appropriate, speech normal Pelvic deferred  NST/monitoring:  Baseline: 145bpm Variability: moderate Accelerations: 15x15 present frequently Decelerations: absent Time: 2 hours Toco: quiet   A/P: 24 y.o. [redacted]w[redacted]d here for antenatal surveillance during pregnancy.  Principle diagnosis: headache in pregnancy  Labor  Not present  Fetal Wellbeing  Reactive NST, reassuring for GA  Anemia  Hemoglobin  8.5  Patient scheduled for IV iron infusions x 5 over the next two weeks, including 3/22 and 3/26.   Headache  CBC, CMP, and P/C ratio without evidence of preeclampsia.  1L LR bolus given, along with 1g Tylenol PO, 25mg  diphenhydramine PO, and 10mg  Compazine IV for headache. Patient reports that  headache, blurry vision, and floaters have completely resolved.   D/c home stable, precautions reviewed, follow-up as scheduled.    Genia Del 10/26/2019 10:11 PM  ----- Genia Del, CNM Certified Nurse Midwife [facility], Department of OB/GYN [facility]

## 2019-10-27 ENCOUNTER — Inpatient Hospital Stay: Payer: 59

## 2019-10-27 ENCOUNTER — Encounter: Payer: Self-pay | Admitting: Oncology

## 2019-10-27 ENCOUNTER — Other Ambulatory Visit: Payer: Self-pay | Admitting: Oncology

## 2019-10-27 VITALS — BP 111/67 | HR 86

## 2019-10-27 DIAGNOSIS — O99013 Anemia complicating pregnancy, third trimester: Secondary | ICD-10-CM | POA: Diagnosis not present

## 2019-10-27 DIAGNOSIS — O99019 Anemia complicating pregnancy, unspecified trimester: Secondary | ICD-10-CM | POA: Insufficient documentation

## 2019-10-27 MED ORDER — IRON SUCROSE 20 MG/ML IV SOLN
200.0000 mg | Freq: Once | INTRAVENOUS | Status: AC
  Administered 2019-10-27: 12:00:00 200 mg via INTRAVENOUS
  Filled 2019-10-27: qty 10

## 2019-10-27 MED ORDER — SODIUM CHLORIDE 0.9 % IV SOLN: 200.0000 mg | Freq: Once | INTRAVENOUS | Status: DC

## 2019-10-27 MED ORDER — SODIUM CHLORIDE 0.9 % IV SOLN
Freq: Once | INTRAVENOUS | Status: AC
  Filled 2019-10-27: qty 250

## 2019-10-27 NOTE — Progress Notes (Signed)
Hematology/Oncology Consult note [facility] [phone] [phone]  Patient Care Team: Carren Rang, Cordelia Poche as PCP - General (Physician Assistant) Creig Hines, MD as Consulting Physician (Hematology and Oncology)   Name of the patient: Sharon Roberts  [id number]  1995/10/07    Reason for referral-anemia in pregnancy   Referring physician-Dr. Myrene Galas  Date of visit: 10/27/19   History of presenting illness-patient is a 24 year old female who is [redacted] weeks pregnant and due for delivery on 11/07/2019.  She has been referred to [place] for anemia.10/17/2019 which showed a white cell count of 9, H&H of 8.7/27.4 and a platelet count of 204.  HIV testing was negative.  Patient reports feeling well currently and denies any complaints at this time.  Her pregnancy is coming along well.  Denies any vaginal spotting.  ECOG PS- 0  Pain scale- 0   Review of systems- Review of Systems  Constitutional: Positive for malaise/fatigue. Negative for chills, fever and weight loss.  HENT: Negative for congestion, ear discharge and nosebleeds.   Eyes: Negative for blurred vision.  Respiratory: Negative for cough, hemoptysis, sputum production, shortness of breath and wheezing.   Cardiovascular: Negative for chest pain, palpitations, orthopnea and claudication.  Gastrointestinal: Negative for abdominal pain, blood in stool, constipation, diarrhea, heartburn, melena, nausea and vomiting.  Genitourinary: Negative for dysuria, flank pain, frequency, hematuria and urgency.  Musculoskeletal: Negative for back pain, joint pain and myalgias.  Skin: Negative for rash.  Neurological: Negative for dizziness, tingling, focal weakness, seizures, weakness and headaches.  Endo/Heme/Allergies: Does not bruise/bleed easily.  Psychiatric/Behavioral: Negative for depression and suicidal ideas. The patient does not have insomnia.     Allergies  Allergen Reactions  . Amoxicillin  Shortness Of Breath and Nausea And Vomiting  . Penicillins Shortness Of Breath and Nausea And Vomiting    Patient Active Problem List   Diagnosis Date Noted  . Anemia in pregnancy 10/27/2019  . Headache in pregnancy, antepartum, third trimester 10/26/2019  . Amenorrhea 10/24/2019  . Encounter for supervision of normal pregnancy in multigravida 04/10/2019  . Asthma 10/13/2013     Past Medical History:  Diagnosis Date  . Anxiety   . Asthma   . Depression      Past Surgical History:  Procedure Laterality Date  . NO PAST SURGERIES      Social History   Socioeconomic History  . Marital status: Single    Spouse name: Not on file  . Number of children: Not on file  . Years of education: Not on file  . Highest education level: Not on file  Occupational History    Employer: [employer]  Tobacco Use  . Smoking status: Never Smoker  . Smokeless tobacco: Never Used  Substance and Sexual Activity  . Alcohol use: No  . Drug use: No  . Sexual activity: Yes    Birth control/protection: I.U.D.  Other Topics Concern  . Not on file  Social History Narrative  . Not on file   Social Determinants of Health   Financial Resource Strain:   . Difficulty of Paying Living Expenses:   Food Insecurity:   . Worried About Programme researcher, broadcasting/film/video in the Last Year:   . Barista in the Last Year:   Transportation Needs:   . Freight forwarder (Medical):   Marland Kitchen Lack of Transportation (Non-Medical):   Physical Activity:   . Days of Exercise per Week:   . Minutes of Exercise per Session:   Stress:   .  Feeling of Stress :   Social Connections:   . Frequency of Communication with Friends and Family:   . Frequency of Social Gatherings with Friends and Family:   . Attends Religious Services:   . Active Member of Clubs or Organizations:   . Attends Archivist Meetings:   Marland Kitchen Marital Status:   Intimate Partner Violence:   . Fear of Current or Ex-Partner:   . Emotionally Abused:    Marland Kitchen Physically Abused:   . Sexually Abused:      Family History  Problem Relation Age of Onset  . Neuropathy Mother   . COPD Mother   . Fibromyalgia Mother   . Hypertension Father   . Healthy Sister   . Healthy Brother   . Healthy Sister      Current Outpatient Medications:  .  albuterol (VENTOLIN HFA) 108 (90 Base) MCG/ACT inhaler, Inhale 2 puffs into the lungs as needed., Disp: , Rfl:  .  cyclobenzaprine (FLEXERIL) 5 MG tablet, Take 1 tablet by mouth 1 day or 1 dose., Disp: , Rfl:  .  ferrous sulfate 325 (65 FE) MG EC tablet, Take 1 tablet by mouth in the morning and at bedtime., Disp: , Rfl:  .  Prenatal 28-0.8 MG TABS, Take 1 tablet by mouth daily., Disp: , Rfl:  .  acetaminophen (TYLENOL) 500 MG tablet, Take 2 tablets (1,000 mg total) by mouth every 6 (six) hours as needed for headache., Disp: , Rfl:    Physical exam:  Vitals:   10/24/19 1105 10/24/19 1107  BP: 112/75   Pulse: 94   Temp: 97.8 F (36.6 C)   TempSrc: Tympanic   SpO2:  99%  Weight: 205 lb (93 kg)   Height: 5\' 5"  (1.651 m)    Physical Exam HENT:     Head: Normocephalic and atraumatic.  Eyes:     Pupils: Pupils are equal, round, and reactive to light.  Cardiovascular:     Rate and Rhythm: Normal rate and regular rhythm.     Heart sounds: Normal heart sounds.  Pulmonary:     Effort: Pulmonary effort is normal.     Breath sounds: Normal breath sounds.  Abdominal:     Comments: Gravid uterus.  Musculoskeletal:     Cervical back: Normal range of motion.  Skin:    General: Skin is warm and dry.  Neurological:     Mental Status: She is alert and oriented to person, place, and time.        CMP Latest Ref Rng & Units 10/26/2019  Glucose 70 - 99 mg/dL 85  BUN 6 - 20 mg/dL 7  Creatinine 0.44 - 1.00 mg/dL 0.50  Sodium 135 - 145 mmol/L 135  Potassium 3.5 - 5.1 mmol/L 3.6  Chloride 98 - 111 mmol/L 109  CO2 22 - 32 mmol/L 20(L)  Calcium 8.9 - 10.3 mg/dL 8.7(L)  Total Protein 6.5 - 8.1 g/dL 6.3(L)   Total Bilirubin 0.3 - 1.2 mg/dL 0.5  Alkaline Phos 38 - 126 U/L 165(H)  AST 15 - 41 U/L 17  ALT 0 - 44 U/L 12   CBC Latest Ref Rng & Units 10/26/2019  WBC 4.0 - 10.5 K/uL 7.9  Hemoglobin 12.0 - 15.0 g/dL 8.5(L)  Hematocrit 36.0 - 46.0 % 26.8(L)  Platelets 150 - 400 K/uL 200    No images are attached to the encounter.  No results found.  Assessment and plan- Patient is a 24 y.o. female referred for anemia in third trimester of  pregnancy.  Patient's hemoglobin in January 2021 was 9.4 and has drifted down to 8.7.  At this time I will do a complete anemia work-up including CBC ferritin and iron studies, B12 and folate,Reticulocyte count, haptoglobin, TSH. Marland Kitchen  She is quite close to her delivery date and it is unclear if you want to be able to give her all 5 doses and have enough time for improvement of her anemia.  Discussed risks and benefits of benefit including all but not limited to nausea, leg swelling and possible risk of infusion reaction.  Patient understands and agrees to proceed as planned   I will see her back if patient has evidence of iron deficiency I have recommended Venofer 200 mg IV twice a week for 5 doses.  In 3 months time with CBC ferritin and iron studies followed by video visit   Thank you for this kind referral and the opportunity to participate in the care of this patient   Visit Diagnosis 1. Anemia affecting pregnancy in third trimester     Dr. Owens Shark, MD, MPH [facility] [phone] 10/27/2019 4:06 PM

## 2019-10-30 ENCOUNTER — Other Ambulatory Visit: Payer: Self-pay

## 2019-10-30 ENCOUNTER — Other Ambulatory Visit
Admission: RE | Admit: 2019-10-30 | Discharge: 2019-10-30 | Disposition: A | Discharge status: Home / Self Care | Payer: 59 | Source: Ambulatory Visit | Attending: Certified Nurse Midwife | Admitting: Certified Nurse Midwife

## 2019-10-30 LAB — SARS CORONAVIRUS 2 (TAT 6-24 HRS): SARS Coronavirus 2: NEGATIVE

## 2019-10-30 NOTE — Progress Notes (Signed)
[id number] with 11/07/2019, by early ultrasound Scheduled for induction of labor for elective at term on 11/02/2019.   Prenatal provider: [facility] Pregnancy complicated by: 1. Anemia - s/p iron transfusion x 2 2. Left shoulder/back pain 3. Rh negative  4. Asthma 5. Pelvic pain  Prenatal Labs: Blood type/Rh A neg  Antibody screen neg  Rubella Immune  Varicella Immune  RPR NR  HBsAg Neg  HIV NR  GC neg  Chlamydia neg  Genetic screening negative  1 hour GTT 103  3 hour GTT N/A  GBS Neg   Tdap: given 09/03/2019 Flu: declined  Contraception: possibly IUD Feeding preference: formula   ____ Margaretmary Eddy, CNM Certified Nurse Midwife [facility]

## 2019-10-31 ENCOUNTER — Inpatient Hospital Stay: Payer: 59

## 2019-10-31 VITALS — BP 114/77 | HR 94 | Temp 96.6°F | Resp 20

## 2019-10-31 DIAGNOSIS — O99013 Anemia complicating pregnancy, third trimester: Secondary | ICD-10-CM

## 2019-10-31 MED ORDER — SODIUM CHLORIDE 0.9 % IV SOLN: 200.0000 mg | Freq: Once | INTRAVENOUS | Status: DC

## 2019-10-31 MED ORDER — SODIUM CHLORIDE 0.9 % IV SOLN
Freq: Once | INTRAVENOUS | Status: AC
  Filled 2019-10-31: qty 250

## 2019-10-31 MED ORDER — IRON SUCROSE 20 MG/ML IV SOLN
200.0000 mg | Freq: Once | INTRAVENOUS | Status: AC
  Administered 2019-10-31: 200 mg via INTRAVENOUS
  Filled 2019-10-31: qty 10

## 2019-11-02 ENCOUNTER — Encounter: Payer: Self-pay | Admitting: Obstetrics & Gynecology

## 2019-11-02 ENCOUNTER — Other Ambulatory Visit: Payer: Self-pay

## 2019-11-02 ENCOUNTER — Inpatient Hospital Stay
Admission: RE | Admit: 2019-11-02 | Discharge: 2019-11-04 | DRG: 807 | Disposition: A | Discharge status: Home / Self Care | Payer: 59 | Attending: Certified Nurse Midwife | Admitting: Certified Nurse Midwife

## 2019-11-02 DIAGNOSIS — Z3A39 39 weeks gestation of pregnancy: Secondary | ICD-10-CM | POA: Diagnosis not present

## 2019-11-02 DIAGNOSIS — Z3490 Encounter for supervision of normal pregnancy, unspecified, unspecified trimester: Secondary | ICD-10-CM | POA: Diagnosis present

## 2019-11-02 DIAGNOSIS — O26893 Other specified pregnancy related conditions, third trimester: Secondary | ICD-10-CM | POA: Diagnosis present

## 2019-11-02 DIAGNOSIS — O9902 Anemia complicating childbirth: Secondary | ICD-10-CM | POA: Diagnosis present

## 2019-11-02 DIAGNOSIS — D649 Anemia, unspecified: Secondary | ICD-10-CM | POA: Diagnosis present

## 2019-11-02 DIAGNOSIS — J45909 Unspecified asthma, uncomplicated: Secondary | ICD-10-CM | POA: Diagnosis present

## 2019-11-02 DIAGNOSIS — Z6791 Unspecified blood type, Rh negative: Secondary | ICD-10-CM

## 2019-11-02 DIAGNOSIS — Z20822 Contact with and (suspected) exposure to covid-19: Secondary | ICD-10-CM | POA: Diagnosis present

## 2019-11-02 DIAGNOSIS — O9952 Diseases of the respiratory system complicating childbirth: Secondary | ICD-10-CM | POA: Diagnosis present

## 2019-11-02 DIAGNOSIS — R519 Headache, unspecified: Secondary | ICD-10-CM

## 2019-11-02 LAB — CBC
HCT: 29.2 % — ABNORMAL LOW (ref 36.0–46.0)
Hemoglobin: 9.3 g/dL — ABNORMAL LOW (ref 12.0–15.0)
MCH: 27.1 pg (ref 26.0–34.0)
MCHC: 31.8 g/dL (ref 30.0–36.0)
MCV: 85.1 fL (ref 80.0–100.0)
Platelets: 172 10*3/uL (ref 150–400)
RBC: 3.43 MIL/uL — ABNORMAL LOW (ref 3.87–5.11)
RDW: 17.3 % — ABNORMAL HIGH (ref 11.5–15.5)
WBC: 7.7 10*3/uL (ref 4.0–10.5)
nRBC: 0 % (ref 0.0–0.2)

## 2019-11-02 LAB — TYPE AND SCREEN
ABO/RH(D): A NEG
Antibody Screen: POSITIVE

## 2019-11-02 LAB — ABO/RH: ABO/RH(D): A NEG

## 2019-11-02 MED ORDER — ONDANSETRON HCL 4 MG/2ML IJ SOLN: 4.0000 mg | Freq: Four times a day (QID) | INTRAMUSCULAR | Status: DC | PRN

## 2019-11-02 MED ORDER — MISOPROSTOL 25 MCG QUARTER TABLET
25.0000 ug | ORAL_TABLET | ORAL | Status: DC | PRN
  Administered 2019-11-02 (×3): 25 ug via VAGINAL
  Filled 2019-11-02 (×3): qty 1

## 2019-11-02 MED ORDER — LACTATED RINGERS IV SOLN: 500.0000 mL | INTRAVENOUS | Status: DC | PRN

## 2019-11-02 MED ORDER — LIDOCAINE HCL (PF) 1 % IJ SOLN
30.0000 mL | INTRAMUSCULAR | Status: DC | PRN
  Filled 2019-11-02: qty 30

## 2019-11-02 MED ORDER — ACETAMINOPHEN 500 MG PO TABS: 1000.0000 mg | ORAL_TABLET | Freq: Four times a day (QID) | ORAL | Status: DC | PRN

## 2019-11-02 MED ORDER — MISOPROSTOL 25 MCG QUARTER TABLET
ORAL_TABLET | ORAL | Status: AC
  Filled 2019-11-02: qty 1

## 2019-11-02 MED ORDER — OXYTOCIN 40 UNITS IN NORMAL SALINE INFUSION - SIMPLE MED: 1.0000 m[IU]/min | INTRAVENOUS | Status: DC

## 2019-11-02 MED ORDER — OXYTOCIN BOLUS FROM INFUSION
500.0000 mL | Freq: Once | INTRAVENOUS | Status: AC
  Administered 2019-11-03: 500 mL via INTRAVENOUS

## 2019-11-02 MED ORDER — OXYTOCIN 40 UNITS IN NORMAL SALINE INFUSION - SIMPLE MED
2.5000 [IU]/h | INTRAVENOUS | Status: DC
  Filled 2019-11-02: qty 1000

## 2019-11-02 MED ORDER — AMMONIA AROMATIC IN INHA
RESPIRATORY_TRACT | Status: AC
  Filled 2019-11-02: qty 10

## 2019-11-02 MED ORDER — SOD CITRATE-CITRIC ACID 500-334 MG/5ML PO SOLN: 30.0000 mL | ORAL | Status: DC | PRN

## 2019-11-02 MED ORDER — TERBUTALINE SULFATE 1 MG/ML IJ SOLN: 0.2500 mg | Freq: Once | INTRAMUSCULAR | Status: DC | PRN

## 2019-11-02 MED ORDER — OXYTOCIN 10 UNIT/ML IJ SOLN
INTRAMUSCULAR | Status: AC
  Filled 2019-11-02: qty 2

## 2019-11-02 MED ORDER — MISOPROSTOL 25 MCG QUARTER TABLET
25.0000 ug | ORAL_TABLET | ORAL | Status: DC
  Administered 2019-11-02 (×2): 25 ug via ORAL
  Filled 2019-11-02: qty 1

## 2019-11-02 MED ORDER — BUTORPHANOL TARTRATE 1 MG/ML IJ SOLN
1.0000 mg | INTRAMUSCULAR | Status: DC | PRN
  Administered 2019-11-02 – 2019-11-03 (×2): 1 mg via INTRAVENOUS
  Filled 2019-11-02 (×2): qty 1

## 2019-11-02 MED ORDER — MISOPROSTOL 200 MCG PO TABS
ORAL_TABLET | ORAL | Status: AC
  Filled 2019-11-02: qty 4

## 2019-11-02 MED ORDER — LACTATED RINGERS IV SOLN
INTRAVENOUS | Status: DC
  Administered 2019-11-02: 125 mL/h via INTRAVENOUS

## 2019-11-02 NOTE — Progress Notes (Signed)
Labor Progress Note  Sharon Roberts is a 24 y.o. G3P2002 at [redacted]w[redacted]d by ultrasound admitted for induction of labor due to Elective at term.  Subjective: Pt is reporting lower back pain and hip pain.  Husband at her side and supportive  Objective: BP 113/68 (BP Location: Left Arm)   Pulse 89   Temp 98.2 F (36.8 C) (Oral)   Resp 20   Ht 5\' 5"  (1.651 m)   Wt 92.5 kg   LMP 02/07/2019   BMI 33.95 kg/m   Fetal Assessment: FHT:  FHR: 135 bpm, variability: moderate,  accelerations:  Present,  decelerations:  Absent Category/reactivity:  Category I UC:   irregular SVE:    Dilation: 1cm  Effacement: 50%  Station:  -3  Consistency: medium  Position: posterior  Membrane status:Intact Amniotic color: n/a  Labs: Lab Results  Component Value Date   WBC 7.7 11/02/2019   HGB 9.3 (L) 11/02/2019   HCT 29.2 (L) 11/02/2019   MCV 85.1 11/02/2019   PLT 172 11/02/2019    Assessment / Plan: Elective induction of labor  Labor: 2nd dose of cytotec placed PO and PV Preeclampsia:  113/68 Fetal Wellbeing:  Category I Pain Control:  Planning epidural once UCs start I/D:  Afebrile, GBS neg, Membranes intact Anticipated MOD:  NSVD  11/04/2019, CNM 11/02/2019, 5:47 PM

## 2019-11-02 NOTE — H&P (Signed)
OB History & Physical   History of Present Illness:  Chief Complaint:   HPI:  Sharon Roberts is a 24 y.o. G72P2002 female at [redacted]w[redacted]d dated by early u/s.  She presents to L&D for schedule elective IOL.    She reports:  -active fetal movement -no leakage of fluid -no vaginal bleeding -occasional contractions  Pregnancy Issues: 1. Anemia - s/p iron transfusion x 2 2. Left shoulder/back pain 3. Rh negative  4. Asthma 5. Pelvic pain   Maternal Medical History:   Past Medical History:  Diagnosis Date  . Anemia   . Anxiety   . Asthma   . Depression     Past Surgical History:  Procedure Laterality Date  . NO PAST SURGERIES      Allergies  Allergen Reactions  . Amoxicillin Shortness Of Breath and Nausea And Vomiting  . Cinnamon Anaphylaxis  . Penicillins Shortness Of Breath and Nausea And Vomiting    Prior to Admission medications   Medication Sig Start Date End Date Taking? Authorizing Provider  acetaminophen (TYLENOL) 500 MG tablet Take 2 tablets (1,000 mg total) by mouth every 6 (six) hours as needed for headache. 10/26/19   Sharon Roberts, CNM  albuterol (VENTOLIN HFA) 108 (90 Base) MCG/ACT inhaler Inhale 2 puffs into the lungs as needed. 05/20/18   [provider]  cyclobenzaprine (FLEXERIL) 5 MG tablet Take 1 tablet by mouth 1 day or 1 dose. 10/08/19   [provider]  ferrous sulfate 325 (65 FE) MG EC tablet Take 1 tablet by mouth in the morning and at bedtime. 10/17/19 10/16/20  [provider]  Prenatal 28-0.8 MG TABS Take 1 tablet by mouth daily.    [provider]     Prenatal care site: [facility]    Social History: She  reports that she has never smoked. She has never used smokeless tobacco. She reports that she does not drink alcohol or use drugs.  Family History: family history includes COPD in her mother; Fibromyalgia in her mother; Healthy in her brother, sister, and sister; Hypertension in her father;  Neuropathy in her mother.   Review of Systems: A full review of systems was performed and negative except as noted in the HPI.    Physical Exam:  Vital Signs: Ht 5\' 5"  (1.651 m)   Wt 92.5 kg   LMP 02/07/2019   BMI 33.95 kg/m   General:   alert and cooperative  Skin:  normal  Neurologic:    Alert & oriented x 3  Lungs:   nl effort  Heart:   regular rate and rhythm  Abdomen:  normal findings: soft, non-tender  Pelvis:  Exam deferred.  Presentations: cephalic  Cervix:    Dilation: 1cm   Effacement: 50%   Station:  -3   Consistency: medium   Position: posterior  Extremities: : non-tender, symmetric    No results found for this or any previous visit (from the past 24 hour(s)).  Pertinent Results:  Prenatal Labs: Blood type/Rh A neg  Antibody screen neg  Rubella Immune  Varicella Immune  RPR NR  HBsAg Neg  HIV NR  GC neg  Chlamydia neg  Genetic screening negative  1 hour GTT 103  3 hour GTT N/A  GBS Neg   FHT: FHR: 130 bpm, variability: moderate,  accelerations:  Present,  decelerations:  Absent Category/reactivity:  Category I TOCO: occasional   Cephalic by SVE   Assessment:  Sharon Roberts is a 24 y.o. [phone] female  at [redacted]w[redacted]d here for elective IOL.   Plan:  1. Admit to Labor & Delivery; consents reviewed and obtained  2. Fetal Well being  - Fetal Tracing: Cat I - GBS neg - Presentation: vtx confirmed by SVE   3. Routine OB: - Prenatal labs reviewed, as above - Rh neg - CBC & T&S on admit - Clear fluids, IVF  4. Induction of Labor -  Contractions: external toco in place -  Pelvis proven to 6lbs 11oz -  Plan for induction with Cytotec -  Plan for continuous fetal monitoring  -  Maternal pain control as desired: IVPM, regional anesthesia - Anticipate vaginal delivery  5. Post Partum Planning: - Infant feeding:Breast - Contraception: IUD  Sharon Roberts, CNM 11/02/2019 12:14 PM

## 2019-11-02 NOTE — Progress Notes (Signed)
Labor Progress Note  Sharon Roberts is a 24 y.o. G3P2002 at [redacted]w[redacted]d by ultrasound admitted for induction of labor due to Elective at term.  Subjective: Pt is comfortable and ready to get started.  Husband at her side and supportive  Objective: BP 125/72   Pulse 89   Temp 97.8 F (36.6 C) (Oral)   Resp 18   Ht 5\' 5"  (1.651 m)   Wt 92.5 kg   LMP 02/07/2019   BMI 33.95 kg/m   Fetal Assessment: FHT:  FHR: 135 bpm, variability: moderate,  accelerations:  Present,  decelerations:  Absent Category/reactivity:  Category I UC:   irregular SVE:    Dilation: 1cm  Effacement: 50%  Station:  -3  Consistency: medium  Position: posterior  Membrane status:Intact Amniotic color: n/a  Labs: Lab Results  Component Value Date   WBC 7.7 11/02/2019   HGB 9.3 (L) 11/02/2019   HCT 29.2 (L) 11/02/2019   MCV 85.1 11/02/2019   PLT 172 11/02/2019    Assessment / Plan: Elective induction of labor  Labor: 1st dose of cytotec placed Preeclampsia:  125/72 Fetal Wellbeing:  Category I Pain Control:  Planning epidural once UCs start I/D:  Afebrile, GBS neg, Membranes intact Anticipated MOD:  NSVD  11/04/2019, CNM 11/02/2019, 2:57 PM

## 2019-11-02 NOTE — Progress Notes (Signed)
Labor Progress Note  Sharon Roberts is a 24 y.o. G3P2002 at [redacted]w[redacted]d by ultrasound admitted for induction of labor due to Elective at term.  Subjective: Pt is reporting lower back pain and hip pain, she is tearful.  Really wants to eat.  Husband at her side and supportive  Objective: BP 113/68 (BP Location: Left Arm)   Pulse 89   Temp 98.2 F (36.8 C) (Oral)   Resp 20   Ht 5\' 5"  (1.651 m)   Wt 92.5 kg   LMP 02/07/2019   BMI 33.95 kg/m   Fetal Assessment: FHT:  FHR: 135 bpm, variability: moderate,  accelerations:  Present,  decelerations:  Absent Category/reactivity:  Category I UC:   Regular Q 2 mins SVE:   Deferred Dilation:   Effacement:   Station:    Consistency:   Position:   Membrane status:Intact Amniotic color: n/a  Assessment / Plan: Elective induction of labor  Labor: 2nd dose of cytotec placed PO and PV   1256 04/10/2019 PV  1734 PO and PV Preeclampsia:  113/68 Fetal Wellbeing:  Category I Pain Control:  Planning epidural once UCs start, Warm shower I/D:  Afebrile, GBS neg, Membranes intact Anticipated MOD:  NSVD  , CNM 11/02/2019, 9:30 PM

## 2019-11-03 ENCOUNTER — Inpatient Hospital Stay: Payer: 59 | Admitting: Anesthesiology

## 2019-11-03 ENCOUNTER — Inpatient Hospital Stay: Payer: 59

## 2019-11-03 ENCOUNTER — Encounter: Payer: Self-pay | Admitting: Oncology

## 2019-11-03 ENCOUNTER — Encounter: Payer: Self-pay | Admitting: Obstetrics & Gynecology

## 2019-11-03 LAB — RPR: RPR Ser Ql: NONREACTIVE

## 2019-11-03 MED ORDER — IBUPROFEN 600 MG PO TABS
ORAL_TABLET | ORAL | Status: AC
  Filled 2019-11-03: qty 1

## 2019-11-03 MED ORDER — FENTANYL 2.5 MCG/ML W/ROPIVACAINE 0.15% IN NS 100 ML EPIDURAL (ARMC)
EPIDURAL | Status: AC
  Filled 2019-11-03: qty 100

## 2019-11-03 MED ORDER — ONDANSETRON HCL 4 MG/2ML IJ SOLN: 4.0000 mg | INTRAMUSCULAR | Status: DC | PRN

## 2019-11-03 MED ORDER — BENZOCAINE-MENTHOL 20-0.5 % EX AERO: 1.0000 "application " | INHALATION_SPRAY | CUTANEOUS | Status: DC | PRN

## 2019-11-03 MED ORDER — FENTANYL 2.5 MCG/ML W/ROPIVACAINE 0.15% IN NS 100 ML EPIDURAL (ARMC)
EPIDURAL | Status: DC | PRN
  Administered 2019-11-03: 12 mL/h via EPIDURAL

## 2019-11-03 MED ORDER — DIPHENHYDRAMINE HCL 50 MG/ML IJ SOLN: 12.5000 mg | INTRAMUSCULAR | Status: DC | PRN

## 2019-11-03 MED ORDER — MISOPROSTOL 200 MCG PO TABS
800.0000 ug | ORAL_TABLET | Freq: Once | ORAL | Status: AC
  Administered 2019-11-03: 800 ug via RECTAL

## 2019-11-03 MED ORDER — TETANUS-DIPHTH-ACELL PERTUSSIS 5-2.5-18.5 LF-MCG/0.5 IM SUSP
0.5000 mL | Freq: Once | INTRAMUSCULAR | Status: DC
  Filled 2019-11-03: qty 0.5

## 2019-11-03 MED ORDER — OXYCODONE HCL 5 MG PO TABS: 5.0000 mg | ORAL_TABLET | ORAL | Status: DC | PRN

## 2019-11-03 MED ORDER — ACETAMINOPHEN 325 MG PO TABS: 650.0000 mg | ORAL_TABLET | ORAL | Status: DC | PRN

## 2019-11-03 MED ORDER — SODIUM CHLORIDE 0.9 % IV SOLN
INTRAVENOUS | Status: DC | PRN
  Administered 2019-11-03 (×2): 5 mL via EPIDURAL

## 2019-11-03 MED ORDER — LACTATED RINGERS IV SOLN: 500.0000 mL | Freq: Once | INTRAVENOUS | Status: DC

## 2019-11-03 MED ORDER — PHENYLEPHRINE 40 MCG/ML (10ML) SYRINGE FOR IV PUSH (FOR BLOOD PRESSURE SUPPORT): 80.0000 ug | PREFILLED_SYRINGE | INTRAVENOUS | Status: DC | PRN

## 2019-11-03 MED ORDER — COCONUT OIL OIL: 1.0000 "application " | TOPICAL_OIL | Status: DC | PRN

## 2019-11-03 MED ORDER — PRENATAL MULTIVITAMIN CH
1.0000 | ORAL_TABLET | Freq: Every day | ORAL | Status: DC
  Administered 2019-11-03: 1 via ORAL
  Filled 2019-11-03: qty 1

## 2019-11-03 MED ORDER — FERROUS SULFATE 325 (65 FE) MG PO TABS
325.0000 mg | ORAL_TABLET | Freq: Two times a day (BID) | ORAL | Status: DC
  Administered 2019-11-03 – 2019-11-04 (×2): 325 mg via ORAL
  Filled 2019-11-03 (×3): qty 1

## 2019-11-03 MED ORDER — SIMETHICONE 80 MG PO CHEW: 80.0000 mg | CHEWABLE_TABLET | ORAL | Status: DC | PRN

## 2019-11-03 MED ORDER — LIDOCAINE-EPINEPHRINE (PF) 1.5 %-1:200000 IJ SOLN
INTRAMUSCULAR | Status: DC | PRN
  Administered 2019-11-03: 3 mL via PERINEURAL

## 2019-11-03 MED ORDER — FENTANYL 2.5 MCG/ML W/ROPIVACAINE 0.15% IN NS 100 ML EPIDURAL (ARMC): 12.0000 mL/h | EPIDURAL | Status: DC

## 2019-11-03 MED ORDER — EPHEDRINE 5 MG/ML INJ: 10.0000 mg | INTRAVENOUS | Status: DC | PRN

## 2019-11-03 MED ORDER — DIBUCAINE (PERIANAL) 1 % EX OINT: 1.0000 "application " | TOPICAL_OINTMENT | CUTANEOUS | Status: DC | PRN

## 2019-11-03 MED ORDER — ONDANSETRON HCL 4 MG PO TABS: 4.0000 mg | ORAL_TABLET | ORAL | Status: DC | PRN

## 2019-11-03 MED ORDER — OXYCODONE HCL 5 MG PO TABS: 10.0000 mg | ORAL_TABLET | ORAL | Status: DC | PRN

## 2019-11-03 MED ORDER — LIDOCAINE HCL (PF) 1 % IJ SOLN
INTRAMUSCULAR | Status: DC | PRN
  Administered 2019-11-03: 2 mL via SUBCUTANEOUS

## 2019-11-03 MED ORDER — DIPHENHYDRAMINE HCL 25 MG PO CAPS: 25.0000 mg | ORAL_CAPSULE | Freq: Four times a day (QID) | ORAL | Status: DC | PRN

## 2019-11-03 MED ORDER — WITCH HAZEL-GLYCERIN EX PADS: 1.0000 "application " | MEDICATED_PAD | CUTANEOUS | Status: DC | PRN

## 2019-11-03 MED ORDER — DOCUSATE SODIUM 100 MG PO CAPS
100.0000 mg | ORAL_CAPSULE | Freq: Two times a day (BID) | ORAL | Status: DC
  Administered 2019-11-03 – 2019-11-04 (×2): 100 mg via ORAL
  Filled 2019-11-03 (×2): qty 1

## 2019-11-03 MED ORDER — IBUPROFEN 600 MG PO TABS
600.0000 mg | ORAL_TABLET | Freq: Four times a day (QID) | ORAL | Status: DC
  Administered 2019-11-03 – 2019-11-04 (×5): 600 mg via ORAL
  Filled 2019-11-03 (×4): qty 1

## 2019-11-03 NOTE — Anesthesia Preprocedure Evaluation (Signed)
Anesthesia Evaluation  Patient identified by MRN, date of birth, ID band Patient awake    Reviewed: Allergy & Precautions, H&P , NPO status , Patient's Chart, lab work & pertinent test results, reviewed documented beta blocker date and time   History of Anesthesia Complications Negative for: history of anesthetic complications  Airway Mallampati: II  TM Distance: >3 FB Neck ROM: full    Dental no notable dental hx.    Pulmonary neg shortness of breath, asthma , neg recent URI,    Pulmonary exam normal breath sounds clear to auscultation       Cardiovascular Exercise Tolerance: Good negative cardio ROS Normal cardiovascular exam Rhythm:regular Rate:Normal     Neuro/Psych PSYCHIATRIC DISORDERS Anxiety Depression negative neurological ROS     GI/Hepatic negative GI ROS, Neg liver ROS,   Endo/Other  negative endocrine ROS  Renal/GU negative Renal ROS  negative genitourinary   Musculoskeletal   Abdominal   Peds  Hematology  (+) Blood dyscrasia, anemia ,   Anesthesia Other Findings Past Medical History: No date: Anemia No date: Anxiety No date: Asthma No date: Depression   Reproductive/Obstetrics negative OB ROS                             Anesthesia Physical Anesthesia Plan  ASA: II  Anesthesia Plan: Epidural   Post-op Pain Management:    Induction:   PONV Risk Score and Plan:   Airway Management Planned:   Additional Equipment:   Intra-op Plan:   Post-operative Plan:   Informed Consent: I have reviewed the patients History and Physical, chart, labs and discussed the procedure including the risks, benefits and alternatives for the proposed anesthesia with the patient or authorized representative who has indicated his/her understanding and acceptance.     Dental Advisory Given  Plan Discussed with: Anesthesiologist, CRNA and Surgeon  Anesthesia Plan Comments:          Anesthesia Quick Evaluation

## 2019-11-03 NOTE — Anesthesia Procedure Notes (Signed)
Epidural Patient location during procedure: OB Start time: 11/03/2019 5:08 AM End time: 11/03/2019 5:11 AM  Staffing Anesthesiologist: Lenard Simmer, MD Performed: anesthesiologist   Preanesthetic Checklist Completed: patient identified, IV checked, site marked, risks and benefits discussed, surgical consent, monitors and equipment checked, pre-op evaluation and timeout performed  Epidural Patient position: sitting Prep: ChloraPrep Patient monitoring: heart rate, continuous pulse ox and blood pressure Approach: midline Location: L3-L4 Injection technique: LOR saline  Needle:  Needle type: Tuohy  Needle gauge: 17 G Needle length: 9 cm and 9 Needle insertion depth: 5.5 cm Catheter type: closed end flexible Catheter size: 19 Gauge Catheter at skin depth: 10.5 cm Test dose: negative and 1.5% lidocaine with Epi 1:200 K  Assessment Sensory level: T10 Events: blood not aspirated, injection not painful, no injection resistance, no paresthesia and negative IV test  Additional Notes 1st attempt Pt. Evaluated and documentation done after procedure finished. Patient identified. Risks/Benefits/Options discussed with patient including but not limited to bleeding, infection, nerve damage, paralysis, failed block, incomplete pain control, headache, blood pressure changes, nausea, vomiting, reactions to medication both or allergic, itching and postpartum back pain. Confirmed with bedside nurse the patient's most recent platelet count. Confirmed with patient that they are not currently taking any anticoagulation, have any bleeding history or any family history of bleeding disorders. Patient expressed understanding and wished to proceed. All questions were answered. Sterile technique was used throughout the entire procedure. Please see nursing notes for vital signs. Test dose was given through epidural catheter and negative prior to continuing to dose epidural or start infusion. Warning signs of  high block given to the patient including shortness of breath, tingling/numbness in hands, complete motor block, or any concerning symptoms with instructions to call for help. Patient was given instructions on fall risk and not to get out of bed. All questions and concerns addressed with instructions to call with any issues or inadequate analgesia.   Patient tolerated the insertion well without immediate complications.Reason for block:procedure for pain

## 2019-11-03 NOTE — Lactation Note (Signed)
This note was copied from a baby's chart. Lactation Consultation Note  Patient Name: Sharon Roberts Today's Date: 11/03/2019   Mom expresses no desire to pump or breast feed Sharon Roberts.  When went in to talk to mom, she reports never having any milk.  She said her milk did not come in with her first baby until 7 days and then she described having symptoms of mastitis.  With her second baby, she said it never came in.  Explained to mom supply and demand and how the baby needed to breast feed frequently to tell the body it needed to produce milk.  Mom reports the other babies did good on formula, so she would rather just stick with that for Sharon Roberts.  Information sheet given on formula preparation and reviewed.  Discussed how to dry up her milk if she was sure she did not want to breast feed or pump her milk.  Explained differences in breast fullness, engorgement, plugged ducts and mastitis and when to seek help.  Encouraged mom to call lactation for assistance if she changed her mind or had further questions or concerns.  Maternal Data    Feeding Feeding Type: Bottle Fed - Formula Nipple Type: Slow - flow  LATCH Score                   Interventions    Lactation Tools Discussed/Used     Consult Status      Louis Meckel 11/03/2019, 8:09 PM

## 2019-11-03 NOTE — Discharge Summary (Signed)
Obstetrical Discharge Summary  Patient Name: Sharon Roberts DOB: Jan 25, 1996 MRN: [id number]  Date of Admission: 11/02/2019 Date of Delivery: 11/03/19 Delivered by: Linda Hedges CNM Date of Discharge: 11/04/2019  Primary OB: [facility]  [id number] last menstrual period was 02/07/2019. EDC Estimated Date of Delivery: 11/07/19 Gestational Age at Delivery: [redacted]w[redacted]d   Antepartum complications:  1. Anemia - s/p iron transfusion x 2 2. Left shoulder/back pain 3. Rh negative  4. Asthma 5. Pelvic pain  Admitting Diagnosis: Elective IOL Secondary Diagnosis: Patient Active Problem List   Diagnosis Date Noted  . Encounter for elective induction of labor 11/02/2019  . Anemia in pregnancy 10/27/2019  . Headache in pregnancy, antepartum, third trimester 10/26/2019  . Amenorrhea 10/24/2019  . Encounter for supervision of normal pregnancy in multigravida 04/10/2019  . Asthma 10/13/2013    Augmentation: none Complications: None  Intrapartum complications/course:  Delivery Type: spontaneous vaginal delivery Anesthesia: epidural Placenta: spontaneous Laceration: Small Periurethral Episiotomy: none Newborn Data: Live born female "Jeter" Birth Weight: 7#9oz  APGAR: 8, 9   Newborn Delivery   Birth date/time: 11/03/2019 05:45:00 Delivery type: Vaginal, Spontaneous      24yo [id number] at 39+4wks presenting for elective IOL, SROM with clear fluid.  She progressed to complete and pushed over an intact perineum and delivered the fetal head, followed promptly by the shoulders. She was in control the whole time, and the baby placed on the maternal abdomen. Delayed cord clamping and the FOB cut his cord, while he was skin to skin. The placenta delivered spontaneously and intact, with trailing membranes. Small periurethral laceration that was hemostatic and did not require a stitch. Mom and baby tolerated the procedure well.   Postpartum Procedures: none  Post partum course:  Patient  had an uncomplicated postpartum course.  By time of discharge on PPD#1, her pain was controlled on oral pain medications; she had appropriate lochia and was ambulating, voiding without difficulty and tolerating regular diet.  She was deemed stable for discharge to home.    Discharge Physical Exam:  BP 106/70 (BP Location: Left Arm)   Pulse 84   Temp 97.9 F (36.6 C) (Oral)   Resp 20   Ht 5\' 5"  (1.651 m)   Wt 92.5 kg   LMP 02/07/2019   SpO2 99%   Breastfeeding Unknown   BMI 33.95 kg/m   General: alert and no distress Pulm: normal respiratory effort Lochia: appropriate Abdomen: soft, NT Uterine Fundus: firm, below umbilicus Extremities: No evidence of DVT seen on physical exam. No lower extremity edema.  Edinburgh:  Edinburgh Postnatal Depression Scale Screening Tool 11/03/2019 11/03/2019  I have been able to laugh and see the funny side of things. 0 (No Data)  I have looked forward with enjoyment to things. 0 -  I have blamed myself unnecessarily when things went wrong. 0 -  I have been anxious or worried for no good reason. 3 -  I have felt scared or panicky for no good reason. 3 -  Things have been getting on top of me. 2 -  I have been so unhappy that I have had difficulty sleeping. 0 -  I have felt sad or miserable. 0 -  I have been so unhappy that I have been crying. 0 -  The thought of harming myself has occurred to me. 0 -  Edinburgh Postnatal Depression Scale Total 8 -     Labs: CBC Latest Ref Rng & Units 11/04/2019 11/02/2019 10/26/2019  WBC 4.0 - 10.5 K/uL 8.8  7.7 7.9  Hemoglobin 12.0 - 15.0 g/dL 7.1(I) 4.5(Y) 0.9(X)  Hematocrit 36.0 - 46.0 % 29.3(L) 29.2(L) 26.8(L)  Platelets 150 - 400 K/uL 165 172 200   A NEG Performed at [facility], [address] [place], [place] [address]  Hemoglobin  Date Value Ref Range Status  11/04/2019 9.1 (L) 12.0 - 15.0 g/dL Final   HGB  Date Value Ref Range Status  10/23/2013 8.5 (L) 12.0 - 16.0 g/dL Final   HCT   Date Value Ref Range Status  11/04/2019 29.3 (L) 36.0 - 46.0 % Final  10/22/2013 27.2 (L) 35.0 - 47.0 % Final    Disposition: stable, discharge to home Baby Feeding: formula Baby Disposition: home with mom  Contraception: planning IUD  Prenatal Labs:  Blood type/Rh A neg  Antibody screen neg  Rubella Immune  Varicella Immune  RPR NR  HBsAg Neg  HIV NR  GC neg  Chlamydia neg  Genetic screening negative  1 hour GTT 103  3 hour GTT n/a  GBS neg   Rh Immune globulin given: Will give PP Rubella vaccine given: n/a Varicella vaccine given: n/a Tdap vaccine given in AP or PP setting: AP 09/03/19 Flu vaccine given in AP or PP setting: AP declined  Plan: [name] was discharged to home in good condition. Follow-up appointment with delivering provider in 4 weeks.  Discharge Instructions: Per After Visit Summary. Activity: Advance as tolerated. Pelvic rest for 6 weeks.   Diet: Regular Discharge Medications: Allergies as of 11/04/2019      Reactions   Amoxicillin Shortness Of Breath, Nausea And Vomiting   Cinnamon Anaphylaxis   Penicillins Shortness Of Breath, Nausea And Vomiting      Medication List    STOP taking these medications   cyclobenzaprine 5 MG tablet Commonly known as: FLEXERIL   ferrous sulfate 325 (65 FE) MG EC tablet Replaced by: ferrous sulfate 325 (65 FE) MG tablet     TAKE these medications   acetaminophen 325 MG tablet Commonly known as: Tylenol Take 2 tablets (650 mg total) by mouth every 4 (four) hours as needed (for pain scale < 4). What changed:   medication strength  how much to take  when to take this  reasons to take this   albuterol 108 (90 Base) MCG/ACT inhaler Commonly known as: VENTOLIN HFA Inhale 2 puffs into the lungs as needed.   docusate sodium 100 MG capsule Commonly known as: COLACE Take 1 capsule (100 mg total) by mouth 2 (two) times daily.   ferrous sulfate 325 (65 FE) MG tablet Take 1 tablet (325 mg  total) by mouth 2 (two) times daily with a meal. Replaces: ferrous sulfate 325 (65 FE) MG EC tablet   ibuprofen 600 MG tablet Commonly known as: ADVIL Take 1 tablet (600 mg total) by mouth every 6 (six) hours.   Prenatal 28-0.8 MG Tabs Take 1 tablet by mouth daily.      Outpatient follow up:  Follow-up Information    Haroldine Laws, CNM Follow up in 4 week(s).   Specialty: Certified Nurse Midwife Why: routine PP exam Contact information: [address] [place] [address] [phone]           Signed: Randa Ngo, CNM 11/04/2019 10:49 AM

## 2019-11-04 LAB — CBC
HCT: 29.3 % — ABNORMAL LOW (ref 36.0–46.0)
Hemoglobin: 9.1 g/dL — ABNORMAL LOW (ref 12.0–15.0)
MCH: 27.4 pg (ref 26.0–34.0)
MCHC: 31.1 g/dL (ref 30.0–36.0)
MCV: 88.3 fL (ref 80.0–100.0)
Platelets: 165 10*3/uL (ref 150–400)
RBC: 3.32 MIL/uL — ABNORMAL LOW (ref 3.87–5.11)
RDW: 18.4 % — ABNORMAL HIGH (ref 11.5–15.5)
WBC: 8.8 10*3/uL (ref 4.0–10.5)
nRBC: 0 % (ref 0.0–0.2)

## 2019-11-04 LAB — FETAL SCREEN: Fetal Screen: NEGATIVE

## 2019-11-04 MED ORDER — RHO D IMMUNE GLOBULIN 1500 UNIT/2ML IJ SOSY
300.0000 ug | PREFILLED_SYRINGE | Freq: Once | INTRAMUSCULAR | Status: AC
  Administered 2019-11-04: 300 ug via INTRAVENOUS
  Filled 2019-11-04: qty 2

## 2019-11-04 MED ORDER — DOCUSATE SODIUM 100 MG PO CAPS: 100.0000 mg | ORAL_CAPSULE | Freq: Two times a day (BID) | ORAL | 0 refills | Status: DC

## 2019-11-04 MED ORDER — ACETAMINOPHEN 325 MG PO TABS: 650.0000 mg | ORAL_TABLET | ORAL | 0 refills | Status: DC | PRN

## 2019-11-04 MED ORDER — FERROUS SULFATE 325 (65 FE) MG PO TABS: 325.0000 mg | ORAL_TABLET | Freq: Two times a day (BID) | ORAL | 1 refills | Status: DC

## 2019-11-04 MED ORDER — IBUPROFEN 600 MG PO TABS: 600.0000 mg | ORAL_TABLET | Freq: Four times a day (QID) | ORAL | 0 refills | Status: DC

## 2019-11-04 NOTE — Progress Notes (Signed)
Post Partum Day 1 Subjective: Doing well, no complaints.  Tolerating regular diet, pain with PO meds, voiding and ambulating without difficulty.  No CP SOB Fever,Chills, N/V or leg pain; denies nipple or breast pain, no HA change of vision, RUQ/epigastric pain  Objective: BP 106/70 (BP Location: Left Arm)   Pulse 84   Temp 97.9 F (36.6 C) (Oral)   Resp 20   Ht 5\' 5"  (1.651 m)   Wt 92.5 kg   LMP 02/07/2019   SpO2 99%   Breastfeeding Unknown   BMI 33.95 kg/m    Physical Exam:  General: NAD Breasts: soft/nontender CV: RRR Pulm: nl effort, CTABL Abdomen: soft, NT, BS x 4  Perineum: minimal edemaperiurethral lacerations hemostatic Lochia: small Uterine Fundus: fundus firm and 1 fb below umbilicus DVT Evaluation: no cords, ttp LEs   Recent Labs    11/02/19 1219 11/04/19 0530  HGB 9.3* 9.1*  HCT 29.2* 29.3*  WBC 7.7 8.8  PLT 172 165    Assessment/Plan: 23 y.o. G3P3003 postpartum day # 1  - Continue routine PP care - Encouraged snug fitting bra and cabbage leaves for bottlefeeding.  - Discussed contraceptive options including implant, IUDs hormonal and non-hormonal, injection, pills/ring/patch, condoms, and NFP. Desires IUD PP.  - Chronic anemia - hemodynamically stable and asymptomatic; continue po ferrous sulfate BID with stool softeners  - Immunization status: all Imms up to date   Disposition: Does desire Dc home today.     11/06/19, CNM 11/04/2019  10:41 AM

## 2019-11-04 NOTE — Anesthesia Postprocedure Evaluation (Signed)
Anesthesia Post Note  Patient: Sharon Roberts  Procedure(s) Performed: AN AD HOC LABOR EPIDURAL  Patient location during evaluation: Mother Baby Anesthesia Type: Epidural Level of consciousness: awake and alert Pain management: pain level controlled Vital Signs Assessment: post-procedure vital signs reviewed and stable Respiratory status: spontaneous breathing, nonlabored ventilation and respiratory function stable Cardiovascular status: stable Postop Assessment: no headache, no backache and epidural receding Anesthetic complications: no     Last Vitals:  Vitals:   11/03/19 1930 11/03/19 2254  BP: 119/76 120/65  Pulse: 77 86  Resp: 18 18  Temp: 36.9 C 36.6 C  SpO2: 100% 100%    Last Pain:  Vitals:   11/04/19 0305  TempSrc:   PainSc: 2                  Jules Schick

## 2019-11-04 NOTE — Progress Notes (Signed)
DC inst reviewed with pt.  Verb u/o.  Discharged to home via WC.

## 2019-11-05 ENCOUNTER — Inpatient Hospital Stay: Payer: 59

## 2019-11-05 LAB — RHOGAM INJECTION: Unit division: 0

## 2019-11-07 ENCOUNTER — Inpatient Hospital Stay: Payer: 59 | Attending: Oncology

## 2020-01-23 ENCOUNTER — Inpatient Hospital Stay: Payer: 59 | Admitting: Oncology

## 2020-01-23 ENCOUNTER — Telehealth: Payer: Self-pay | Admitting: Oncology

## 2020-01-23 NOTE — Telephone Encounter (Signed)
Patient did not attend appt on 01-23-20. Writer phoned patient on this date and rescheduled appt for 02-05-20 and it will be inperson. Patient stated that she would get her labs drawn at [facility] prior to her appt.

## 2020-02-05 ENCOUNTER — Inpatient Hospital Stay: Payer: 59 | Admitting: Oncology

## 2020-02-05 ENCOUNTER — Encounter: Payer: Self-pay | Admitting: Oncology

## 2020-02-05 ENCOUNTER — Telehealth: Payer: Self-pay | Admitting: Oncology

## 2020-02-05 NOTE — Telephone Encounter (Signed)
Patient missed appt on this date. Writer phoned patient on this date to reschedule, but did not reach patient. Voicemail left [facility]. Letter also sent to patient requesting [facility] phone [facility] to reschedule.

## 2020-09-03 ENCOUNTER — Encounter (HOSPITAL_COMMUNITY)
Admission: EM | Disposition: A | Discharge status: Home / Self Care | Payer: Self-pay | Source: Home / Self Care | Attending: Emergency Medicine

## 2020-09-03 ENCOUNTER — Encounter (HOSPITAL_COMMUNITY): Payer: Self-pay

## 2020-09-03 ENCOUNTER — Emergency Department (HOSPITAL_COMMUNITY): Payer: 59 | Admitting: Certified Registered Nurse Anesthetist

## 2020-09-03 ENCOUNTER — Emergency Department (HOSPITAL_COMMUNITY): Payer: 59

## 2020-09-03 ENCOUNTER — Ambulatory Visit (HOSPITAL_COMMUNITY)
Admission: EM | Admit: 2020-09-03 | Discharge: 2020-09-03 | Disposition: A | Discharge status: Home / Self Care | Payer: 59 | Attending: Emergency Medicine | Admitting: Emergency Medicine

## 2020-09-03 DIAGNOSIS — R1031 Right lower quadrant pain: Secondary | ICD-10-CM | POA: Diagnosis present

## 2020-09-03 DIAGNOSIS — N83209 Unspecified ovarian cyst, unspecified side: Secondary | ICD-10-CM

## 2020-09-03 DIAGNOSIS — N8302 Follicular cyst of left ovary: Secondary | ICD-10-CM | POA: Diagnosis not present

## 2020-09-03 DIAGNOSIS — N83519 Torsion of ovary and ovarian pedicle, unspecified side: Secondary | ICD-10-CM | POA: Diagnosis present

## 2020-09-03 DIAGNOSIS — R102 Pelvic and perineal pain: Secondary | ICD-10-CM | POA: Diagnosis present

## 2020-09-03 DIAGNOSIS — Z91018 Allergy to other foods: Secondary | ICD-10-CM | POA: Diagnosis not present

## 2020-09-03 DIAGNOSIS — D649 Anemia, unspecified: Secondary | ICD-10-CM

## 2020-09-03 DIAGNOSIS — N8353 Torsion of ovary, ovarian pedicle and fallopian tube: Secondary | ICD-10-CM | POA: Diagnosis present

## 2020-09-03 DIAGNOSIS — N83202 Unspecified ovarian cyst, left side: Secondary | ICD-10-CM | POA: Diagnosis not present

## 2020-09-03 DIAGNOSIS — Z881 Allergy status to other antibiotic agents status: Secondary | ICD-10-CM | POA: Insufficient documentation

## 2020-09-03 DIAGNOSIS — Z880 Allergy status to penicillin: Secondary | ICD-10-CM | POA: Insufficient documentation

## 2020-09-03 DIAGNOSIS — Z20822 Contact with and (suspected) exposure to covid-19: Secondary | ICD-10-CM | POA: Insufficient documentation

## 2020-09-03 HISTORY — DX: Unspecified ovarian cyst, unspecified side: N83.209

## 2020-09-03 HISTORY — DX: Anemia, unspecified: D64.9

## 2020-09-03 HISTORY — PX: LAPAROSCOPY: SHX197

## 2020-09-03 HISTORY — DX: Torsion of ovary, ovarian pedicle and fallopian tube: N83.53

## 2020-09-03 LAB — CBC WITH DIFFERENTIAL/PLATELET
Abs Immature Granulocytes: 0.04 10*3/uL (ref 0.00–0.07)
Basophils Absolute: 0.1 10*3/uL (ref 0.0–0.1)
Basophils Relative: 1 %
Eosinophils Absolute: 0.1 10*3/uL (ref 0.0–0.5)
Eosinophils Relative: 1 %
HCT: 40.2 % (ref 36.0–46.0)
Hemoglobin: 13.7 g/dL (ref 12.0–15.0)
Immature Granulocytes: 0 %
Lymphocytes Relative: 20 %
Lymphs Abs: 1.8 10*3/uL (ref 0.7–4.0)
MCH: 31.1 pg (ref 26.0–34.0)
MCHC: 34.1 g/dL (ref 30.0–36.0)
MCV: 91.4 fL (ref 80.0–100.0)
Monocytes Absolute: 0.5 10*3/uL (ref 0.1–1.0)
Monocytes Relative: 6 %
Neutro Abs: 6.5 10*3/uL (ref 1.7–7.7)
Neutrophils Relative %: 72 %
Platelets: 256 10*3/uL (ref 150–400)
RBC: 4.4 MIL/uL (ref 3.87–5.11)
RDW: 12.2 % (ref 11.5–15.5)
WBC: 9.1 10*3/uL (ref 4.0–10.5)
nRBC: 0 % (ref 0.0–0.2)

## 2020-09-03 LAB — URINALYSIS, ROUTINE W REFLEX MICROSCOPIC
Bilirubin Urine: NEGATIVE
Glucose, UA: NEGATIVE mg/dL
Hgb urine dipstick: NEGATIVE
Ketones, ur: NEGATIVE mg/dL
Leukocytes,Ua: NEGATIVE
Nitrite: NEGATIVE
Protein, ur: NEGATIVE mg/dL
Specific Gravity, Urine: 1.026 (ref 1.005–1.030)
pH: 5 (ref 5.0–8.0)

## 2020-09-03 LAB — WET PREP, GENITAL
Clue Cells Wet Prep HPF POC: NONE SEEN
Sperm: NONE SEEN
Trich, Wet Prep: NONE SEEN
Yeast Wet Prep HPF POC: NONE SEEN

## 2020-09-03 LAB — COMPREHENSIVE METABOLIC PANEL
ALT: 17 U/L (ref 0–44)
AST: 18 U/L (ref 15–41)
Albumin: 4.2 g/dL (ref 3.5–5.0)
Alkaline Phosphatase: 47 U/L (ref 38–126)
Anion gap: 10 (ref 5–15)
BUN: 13 mg/dL (ref 6–20)
CO2: 24 mmol/L (ref 22–32)
Calcium: 9 mg/dL (ref 8.9–10.3)
Chloride: 104 mmol/L (ref 98–111)
Creatinine, Ser: 0.72 mg/dL (ref 0.44–1.00)
GFR, Estimated: >60 mL/min (ref >60)
Glucose, Bld: 97 mg/dL (ref 70–99)
Potassium: 3.6 mmol/L (ref 3.5–5.1)
Sodium: 138 mmol/L (ref 135–145)
Total Bilirubin: 0.5 mg/dL (ref 0.3–1.2)
Total Protein: 7.1 g/dL (ref 6.5–8.1)

## 2020-09-03 LAB — PREGNANCY, URINE: Preg Test, Ur: NEGATIVE

## 2020-09-03 LAB — LIPASE, BLOOD: Lipase: 34 U/L (ref 11–51)

## 2020-09-03 LAB — SARS CORONAVIRUS 2 BY RT PCR (HOSPITAL ORDER, PERFORMED IN ~~LOC~~ HOSPITAL LAB): SARS Coronavirus 2: NEGATIVE

## 2020-09-03 SURGERY — LAPAROSCOPY, DIAGNOSTIC
Anesthesia: General | Site: Abdomen

## 2020-09-03 MED ORDER — LIDOCAINE 2% (20 MG/ML) 5 ML SYRINGE
INTRAMUSCULAR | Status: DC | PRN
  Administered 2020-09-03: 80 mg via INTRAVENOUS

## 2020-09-03 MED ORDER — ACETAMINOPHEN 500 MG PO TABS
ORAL_TABLET | ORAL | Status: AC
  Administered 2020-09-03: 1000 mg via ORAL
  Filled 2020-09-03: qty 2

## 2020-09-03 MED ORDER — KETOROLAC TROMETHAMINE 30 MG/ML IJ SOLN
INTRAMUSCULAR | Status: DC | PRN
  Administered 2020-09-03: 30 mg via INTRAVENOUS

## 2020-09-03 MED ORDER — SODIUM CHLORIDE 0.9 % IV SOLN: Freq: Once | INTRAVENOUS | Status: AC

## 2020-09-03 MED ORDER — SCOPOLAMINE 1 MG/3DAYS TD PT72
MEDICATED_PATCH | TRANSDERMAL | Status: AC
  Filled 2020-09-03: qty 1

## 2020-09-03 MED ORDER — ROCURONIUM BROMIDE 10 MG/ML (PF) SYRINGE
PREFILLED_SYRINGE | INTRAVENOUS | Status: DC | PRN
  Administered 2020-09-03: 80 mg via INTRAVENOUS

## 2020-09-03 MED ORDER — FENTANYL CITRATE (PF) 250 MCG/5ML IJ SOLN
INTRAMUSCULAR | Status: AC
  Filled 2020-09-03: qty 5

## 2020-09-03 MED ORDER — GABAPENTIN 300 MG PO CAPS
ORAL_CAPSULE | ORAL | Status: AC
  Administered 2020-09-03: 300 mg via ORAL
  Filled 2020-09-03: qty 1

## 2020-09-03 MED ORDER — SUGAMMADEX SODIUM 200 MG/2ML IV SOLN
INTRAVENOUS | Status: DC | PRN
  Administered 2020-09-03: 200 mg via INTRAVENOUS

## 2020-09-03 MED ORDER — LACTATED RINGERS IV SOLN: INTRAVENOUS | Status: DC

## 2020-09-03 MED ORDER — SCOPOLAMINE 1 MG/3DAYS TD PT72
1.0000 | MEDICATED_PATCH | TRANSDERMAL | Status: DC
  Administered 2020-09-03: 1.5 mg via TRANSDERMAL

## 2020-09-03 MED ORDER — BUPIVACAINE HCL (PF) 0.5 % IJ SOLN
INTRAMUSCULAR | Status: DC | PRN
  Administered 2020-09-03: 8 mL

## 2020-09-03 MED ORDER — GABAPENTIN 300 MG PO CAPS: 300.0000 mg | ORAL_CAPSULE | ORAL | Status: AC

## 2020-09-03 MED ORDER — KETOROLAC TROMETHAMINE 30 MG/ML IJ SOLN
INTRAMUSCULAR | Status: AC
  Filled 2020-09-03: qty 1

## 2020-09-03 MED ORDER — BUPIVACAINE HCL (PF) 0.5 % IJ SOLN
INTRAMUSCULAR | Status: AC
  Filled 2020-09-03: qty 30

## 2020-09-03 MED ORDER — ACETAMINOPHEN 500 MG PO TABS: 1000.0000 mg | ORAL_TABLET | ORAL | Status: AC

## 2020-09-03 MED ORDER — ONDANSETRON HCL 4 MG/2ML IJ SOLN
INTRAMUSCULAR | Status: DC | PRN
  Administered 2020-09-03: 4 mg via INTRAVENOUS

## 2020-09-03 MED ORDER — OXYCODONE HCL 5 MG PO TABS: 5.0000 mg | ORAL_TABLET | ORAL | 0 refills | Status: DC | PRN

## 2020-09-03 MED ORDER — SODIUM CHLORIDE 0.9 % IR SOLN
Status: DC | PRN
  Administered 2020-09-03: 1000 mL

## 2020-09-03 MED ORDER — CHLORHEXIDINE GLUCONATE 0.12 % MT SOLN
OROMUCOSAL | Status: AC
  Administered 2020-09-03: 15 mL via OROMUCOSAL
  Filled 2020-09-03: qty 15

## 2020-09-03 MED ORDER — MIDAZOLAM HCL 2 MG/2ML IJ SOLN
INTRAMUSCULAR | Status: AC
  Filled 2020-09-03: qty 2

## 2020-09-03 MED ORDER — FENTANYL CITRATE (PF) 250 MCG/5ML IJ SOLN
INTRAMUSCULAR | Status: DC | PRN
  Administered 2020-09-03 (×3): 50 ug via INTRAVENOUS
  Administered 2020-09-03: 25 ug via INTRAVENOUS

## 2020-09-03 MED ORDER — PROPOFOL 10 MG/ML IV BOLUS
INTRAVENOUS | Status: AC
  Filled 2020-09-03: qty 20

## 2020-09-03 MED ORDER — DOCUSATE SODIUM 100 MG PO CAPS: 100.0000 mg | ORAL_CAPSULE | Freq: Two times a day (BID) | ORAL | 2 refills | Status: DC | PRN

## 2020-09-03 MED ORDER — IBUPROFEN 600 MG PO TABS: 600.0000 mg | ORAL_TABLET | Freq: Four times a day (QID) | ORAL | 2 refills | Status: DC | PRN

## 2020-09-03 MED ORDER — FENTANYL CITRATE (PF) 100 MCG/2ML IJ SOLN
75.0000 ug | INTRAMUSCULAR | Status: AC | PRN
  Administered 2020-09-03 (×2): 75 ug via INTRAVENOUS
  Filled 2020-09-03 (×2): qty 2

## 2020-09-03 MED ORDER — MIDAZOLAM HCL 2 MG/2ML IJ SOLN
INTRAMUSCULAR | Status: DC | PRN
  Administered 2020-09-03: 2 mg via INTRAVENOUS

## 2020-09-03 MED ORDER — FENTANYL CITRATE (PF) 100 MCG/2ML IJ SOLN
INTRAMUSCULAR | Status: AC
  Filled 2020-09-03: qty 2

## 2020-09-03 MED ORDER — POVIDONE-IODINE 10 % EX SWAB: 2.0000 "application " | Freq: Once | CUTANEOUS | Status: DC

## 2020-09-03 MED ORDER — CHLORHEXIDINE GLUCONATE 0.12 % MT SOLN
15.0000 mL | Freq: Once | OROMUCOSAL | Status: AC
  Filled 2020-09-03: qty 15

## 2020-09-03 MED ORDER — DEXAMETHASONE SODIUM PHOSPHATE 10 MG/ML IJ SOLN
INTRAMUSCULAR | Status: DC | PRN
  Administered 2020-09-03: 10 mg via INTRAVENOUS

## 2020-09-03 MED ORDER — PROPOFOL 10 MG/ML IV BOLUS
INTRAVENOUS | Status: DC | PRN
  Administered 2020-09-03: 140 mg via INTRAVENOUS

## 2020-09-03 MED ORDER — FENTANYL CITRATE (PF) 100 MCG/2ML IJ SOLN
25.0000 ug | INTRAMUSCULAR | Status: DC | PRN
  Administered 2020-09-03 (×2): 50 ug via INTRAVENOUS

## 2020-09-03 MED ORDER — AMISULPRIDE (ANTIEMETIC) 5 MG/2ML IV SOLN: 10.0000 mg | Freq: Once | INTRAVENOUS | Status: DC | PRN

## 2020-09-03 SURGICAL SUPPLY — 35 items
APPLICATOR CHLORAPREP 3ML ORNG (MISCELLANEOUS) ×2 IMPLANT
COVER MAYO STAND STRL (DRAPES) IMPLANT
COVER WAND RF STERILE (DRAPES) IMPLANT
DERMABOND ADHESIVE PROPEN (GAUZE/BANDAGES/DRESSINGS) ×1
DERMABOND ADVANCED (GAUZE/BANDAGES/DRESSINGS)
DERMABOND ADVANCED .7 DNX12 (GAUZE/BANDAGES/DRESSINGS) IMPLANT
DERMABOND ADVANCED .7 DNX6 (GAUZE/BANDAGES/DRESSINGS) ×1 IMPLANT
DRSG OPSITE POSTOP 3X4 (GAUZE/BANDAGES/DRESSINGS) IMPLANT
DURAPREP 26ML APPLICATOR (WOUND CARE) IMPLANT
GAUZE SPONGE 4X4 16PLY XRAY LF (GAUZE/BANDAGES/DRESSINGS) ×2 IMPLANT
GLOVE BIOGEL PI IND STRL 7.0 (GLOVE) IMPLANT
GLOVE BIOGEL PI INDICATOR 7.0 (GLOVE)
GOWN STRL REUS W/ TWL LRG LVL3 (GOWN DISPOSABLE) IMPLANT
GOWN STRL REUS W/TWL LRG LVL3 (GOWN DISPOSABLE)
HEMOSTAT ARISTA ABSORB 3G PWDR (HEMOSTASIS) ×2 IMPLANT
KIT TURNOVER KIT B (KITS) IMPLANT
LIGASURE VESSEL 5MM BLUNT TIP (ELECTROSURGICAL) IMPLANT
NEEDLE INSUFFLATION 14GA 120MM (NEEDLE) IMPLANT
NS IRRIG 1000ML POUR BTL (IV SOLUTION) ×2 IMPLANT
PACK LAPAROSCOPY BASIN (CUSTOM PROCEDURE TRAY) ×2 IMPLANT
PACK TRENDGUARD 450 HYBRID PRO (MISCELLANEOUS) IMPLANT
POUCH SPECIMEN RETRIEVAL 10MM (ENDOMECHANICALS) IMPLANT
PROTECTOR NERVE ULNAR (MISCELLANEOUS) IMPLANT
SET IRRIG TUBING LAPAROSCOPIC (IRRIGATION / IRRIGATOR) ×2 IMPLANT
SET TUBE SMOKE EVAC HIGH FLOW (TUBING) ×2 IMPLANT
SHEARS HARMONIC ACE PLUS 36CM (ENDOMECHANICALS) IMPLANT
SLEEVE ENDOPATH XCEL 5M (ENDOMECHANICALS) ×4 IMPLANT
SUT VICRYL 0 UR6 27IN ABS (SUTURE) IMPLANT
SUT VICRYL 4-0 PS2 18IN ABS (SUTURE) IMPLANT
TOWEL GREEN STERILE FF (TOWEL DISPOSABLE) IMPLANT
TRAY FOLEY W/BAG SLVR 14FR (SET/KITS/TRAYS/PACK) ×2 IMPLANT
TRENDGUARD 450 HYBRID PRO PACK (MISCELLANEOUS)
TROCAR XCEL NON-BLD 11X100MML (ENDOMECHANICALS) IMPLANT
TROCAR XCEL NON-BLD 5MMX100MML (ENDOMECHANICALS) IMPLANT
WARMER LAPAROSCOPE (MISCELLANEOUS) ×2 IMPLANT

## 2020-09-03 NOTE — ED Provider Notes (Signed)
Sharon Roberts Provider Note   CSN: [id number] Arrival date & time: 09/03/20  5465     History Chief Complaint  Patient presents with  . Abdominal Pain    Sharon Roberts is a 25 y.o. female.  HPI  Patient is a 25 year old female with past medical history significant for multiple episodes of cyst which she states usually cause approximately 30 minutes of pain.  She states that this morning she woke up at approximately 5:30 AM with severe onset of right lower pelvic pain.  She states that she has an IUD and states that she is not concerned about pregnancy but does state that she is sexually active.  She denies any vaginal discharge or vaginal bleeding.  She states the quality is sharp and stabbing and cramping she states that it is constant waxing and waning.  Seems to be worse with movement and with lying down.  Seems to radiate to her low back.  She denies any nausea vomiting, dysuria, frequency, urgency, no recent intercourse with which to indicate dyspareunia present or absent.  Denies any change of her bowel movements and states that she is chronically and constantly mildly constipated.  No changes today.  She states that the severity of the pain was 10/10 at onset and states that it was severe enough that she was somewhat sweaty and lightheaded.  She states that she initially thought that it could be another cyst and therefore waited patiently for the pain to subside however it did not.  She denies any other associated symptoms no chest pain upper abdominal pain, lightheadedness, shortness of breath, cough, fever, congestion. No medications taken prior to arrival.  No other associated symptoms.    Past Medical History:  Diagnosis Date  . Anemia   . Anxiety   . Asthma   . Depression     Patient Active Problem List   Diagnosis Date Noted  . Encounter for elective induction of labor 11/02/2019  . Anemia in pregnancy 10/27/2019  . Headache in  pregnancy, antepartum, third trimester 10/26/2019  . Amenorrhea 10/24/2019  . Encounter for supervision of normal pregnancy in multigravida 04/10/2019  . Asthma 10/13/2013    Past Surgical History:  Procedure Laterality Date  . NO PAST SURGERIES       OB History    Gravida  3   Para  3   Term  3   Preterm      AB      Living  3     SAB      IAB      Ectopic      Multiple  0   Live Births  3           Family History  Problem Relation Age of Onset  . Neuropathy Mother   . COPD Mother   . Fibromyalgia Mother   . Hypertension Father   . Healthy Sister   . Healthy Brother   . Healthy Sister     Social History   Tobacco Use  . Smoking status: Never Smoker  . Smokeless tobacco: Never Used  Vaping Use  . Vaping Use: Never used  Substance Use Topics  . Alcohol use: No  . Drug use: No    Home Medications Prior to Admission medications   Medication Sig Start Date End Date Taking? Authorizing Provider  acetaminophen (TYLENOL) 325 MG tablet Take 2 tablets (650 mg total) by mouth every 4 (four) hours as needed (for pain scale <  4). Patient taking differently: Take 650 mg by mouth every 4 (four) hours as needed for headache. 11/04/19  Yes McVey, Prudencio Pair, CNM  docusate sodium (COLACE) 100 MG capsule Take 1 capsule (100 mg total) by mouth 2 (two) times daily. Patient not taking: No sig reported 11/04/19   McVey, Prudencio Pair, CNM  ferrous sulfate 325 (65 FE) MG tablet Take 1 tablet (325 mg total) by mouth 2 (two) times daily with a meal. Patient not taking: No sig reported 11/04/19   McVey, Lurena Joiner A, CNM  ibuprofen (ADVIL) 600 MG tablet Take 1 tablet (600 mg total) by mouth every 6 (six) hours. Patient not taking: No sig reported 11/04/19   McVey, Prudencio Pair, CNM    Allergies    Amoxicillin, Cinnamon, and Penicillins  Review of Systems   Review of Systems  Constitutional: Negative for chills and fever.  HENT: Negative for congestion.   Eyes: Negative for  pain.  Respiratory: Negative for cough and shortness of breath.   Cardiovascular: Negative for chest pain and leg swelling.  Gastrointestinal: Positive for abdominal pain and constipation (chronic - unchanged today). Negative for diarrhea, nausea and vomiting.  Genitourinary: Positive for pelvic pain. Negative for dysuria, flank pain, frequency, genital sores, hematuria, vaginal bleeding, vaginal discharge and vaginal pain.  Musculoskeletal: Negative for myalgias.  Skin: Negative for rash.  Neurological: Negative for dizziness and headaches.    Physical Exam Updated Vital Signs BP 100/64   Pulse 76   Temp 98.5 F (36.9 C) (Oral)   Resp 18   Ht 5\' 4"  (1.626 m)   Wt 83.5 kg   SpO2 96%   BMI 31.58 kg/m   Physical Exam Vitals and nursing note reviewed.  Constitutional:      General: She is not in acute distress. HENT:     Head: Normocephalic and atraumatic.     Nose: Nose normal.  Eyes:     General: No scleral icterus. Cardiovascular:     Rate and Rhythm: Normal rate and regular rhythm.     Pulses: Normal pulses.     Heart sounds: Normal heart sounds.  Pulmonary:     Effort: Pulmonary effort is normal. No respiratory distress.     Breath sounds: No wheezing.  Abdominal:     Palpations: Abdomen is soft.     Tenderness: There is abdominal tenderness.     Hernia: No hernia is present.     Comments: Focal significant right lower abdominal pain that is notably in the pelvic region. Less significant left-sided tenderness. No other abdominal tenderness. No guarding or rebound.  Genitourinary:    Comments: Vulva without lesions or abnormality Vaginal canal without lesions, scant watery/yellowish fluid present no purulence or abnormal odor Cervix is closed, IUD strings visualized Focal right adnexal tenderness.  No cervical motion tenderness Musculoskeletal:     Cervical back: Normal range of motion.     Right lower leg: No edema.     Left lower leg: No edema.  Skin:     General: Skin is warm and dry.     Capillary Refill: Capillary refill takes less than 2 seconds.  Neurological:     Mental Status: She is alert. Mental status is at baseline.  Psychiatric:        Mood and Affect: Mood normal.        Behavior: Behavior normal.     ED Results / Procedures / Treatments   Labs (all labs ordered are listed, but only abnormal results are displayed)  Labs Reviewed  WET PREP, GENITAL - Abnormal; Notable for the following components:      Result Value   WBC, Wet Prep HPF POC MANY (*)    All other components within normal limits  URINALYSIS, ROUTINE W REFLEX MICROSCOPIC - Abnormal; Notable for the following components:   APPearance HAZY (*)    All other components within normal limits  SARS CORONAVIRUS 2 BY RT PCR (HOSPITAL ORDER, PERFORMED IN [facility])  CBC WITH DIFFERENTIAL/PLATELET  COMPREHENSIVE METABOLIC PANEL  LIPASE, BLOOD  PREGNANCY, URINE  RPR  GC/CHLAMYDIA PROBE AMP ([place]) NOT AT [facility]    EKG None  Radiology US PELVIC COMPLETE W TRANSVAGINAL AND TORSION R/O  Result Date: 09/03/2020 CLINICAL DATA:  Pelvic pain EXAM: TRANSABDOMINAL AND TRANSVAGINAL ULTRASOUND OF PELVIS DOPPLER ULTRASOUND OF OVARIES TECHNIQUE: Study was performed transabdominally to optimize pelvic field of view evaluation and transvaginally to optimize internal visceral architecture evaluation. Color and duplex Doppler ultrasound was utilized to evaluate blood flow to the ovaries. COMPARISON:  May 22, 2014 FINDINGS: Uterus Measurements: 10.1 x 5.8 x 7.5 cm = volume: 229.8 mL. No fibroids or other mass visualized. Endometrium Thickness: 3 mm. Intrauterine device present within the endometrium. No focal abnormality visualized. Right ovary Measurements: 3.5 x 2.2 x 1.3 cm = volume: 5.2 mL. Normal appearance/no adnexal mass. Left ovary Measurements: 5.1 x 5.1 x 6.7 cm. = volume: 90.9 mL. There is a cyst arising from the right ovary measuring 5.3 x 4.4 x 5.5 cm.  Pulsed Doppler evaluation of both ovaries demonstrates normal low-resistance arterial and venous waveforms. There appears to be somewhat diminished flow in the right ovary compared to the left side. Other findings: Moderate free pelvic fluid noted. IMPRESSION: 1. Doppler flow is seen in each ovary. Note that flow in the right ovary appears somewhat diminished compared to the left side. Clinical significance of this finding is uncertain. This finding potentially could be indicative of a degree of intermittent ovarian torsion. Close clinical surveillance in this regard is felt to be warranted. 2. There is a cyst arising in the left adnexa measuring 5.3 x 4.4 x 5.5 cm. No other extrauterine pelvic mass. Recommend follow-up US in 3-6 months per consensus guidelines. Note: This recommendation does not apply to premenarchal patients or to those with increased risk (genetic, family history, elevated tumor markers or other high-risk factors) of ovarian cancer. Reference: Radiology 2019 Nov; 293(2):359-371. 3. Moderate free fluid in the cul-de-sac may be due to recent ovarian cyst leakage. 4. Intrauterine device within the endometrium. No endometrial thickening. 5.  No intrauterine mass. Electronically Signed   By: Bretta Bang III M.D.   On: 09/03/2020 08:51    Procedures .Critical Care Performed by: Gailen Shelter, PA Authorized by: Gailen Shelter, PA   Critical care provider statement:    Critical care time (minutes):  45   Critical care time was exclusive of:  Separately billable procedures and treating other patients and teaching time   Critical care was necessary to treat or prevent imminent or life-threatening deterioration of the following conditions: Ovarian torsion.   Critical care was time spent personally by me on the following activities:  Discussions with consultants, evaluation of patient's response to treatment, examination of patient, review of old charts, re-evaluation of patient's  condition, pulse oximetry, ordering and review of radiographic studies, ordering and review of laboratory studies and ordering and performing treatments and interventions   I assumed direction of critical care for this patient from another  provider in my specialty: no       Medications Ordered in ED Medications  fentaNYL (SUBLIMAZE) injection 75 mcg (75 mcg Intravenous Given 09/03/20 1012)  0.9 %  sodium chloride infusion ( Intravenous Stopped 09/03/20 0959)    ED Course  I have reviewed the triage vital signs and the nursing notes.  Pertinent labs & imaging results that were available during my care of the patient were reviewed by me and considered in my medical decision making (see chart for details).  Patient is a 25 year old female with past medical history significant for cysts she states that she was told that she is has a left-sided cyst.  She states she has had some rupture in the past and she states that usually the pain last for approximately 30 minutes.  She states that she is presently having severe right lower pain which is constant started approximately 5:30 AM and has been present since.  She states she has never had pain is constant or severe in the past.  Physical exam is notable for right adnexal tenderness.  Right lower quadrant abdominal pain seems to be more pelvic in origin.  I have low suspicion for appendicitis given the sudden onset of her pain and low suspicion for ectopic bradycardia given her negative pregnancy test and the presence of an IUD in her uterus presently. She has no urinary symptoms and urinalysis without any evidence of infection.  CMP and CBC are without any acute abnormalities lipase within normal limits.  2-hour Covid test ordered given my concern for torsion and possible need for surgery.   Ultrasound shows questionable intermittent torsion--given that there is decreased flow in the right side and she is having right adnexal tenderness will discuss with  OB/GYN.  Clinical Course as of 09/03/20 1114  Fri Sep 03, 2020  0950 Discussed with OBGYN who will discuss with OR at [facility] and determine availability for diagnostic lap [WF]  1022 12:30 - OR will be available at [facility]. Dr. Macon Large She will go to main [facility] entrance and check in.  [WF]    Clinical Course User Index [WF] Gailen Shelter, PA   Patient will go immediately by POV to [facility].   MDM Rules/Calculators/A&P                          Final Clinical Impression(s) / ED Diagnoses Final diagnoses:  Adnexal pain    Rx / DC Orders ED Discharge Orders    None       Gailen Shelter, [place] 09/03/20 1116    Linwood Dibbles, MD 09/04/20 [phone]

## 2020-09-03 NOTE — Anesthesia Procedure Notes (Signed)
Procedure Name: Intubation Date/Time: 09/03/2020 12:53 PM Performed by: Dorthea Cove, CRNA Pre-anesthesia Checklist: Patient identified, Emergency Drugs available, Suction available and Patient being monitored Patient Re-evaluated:Patient Re-evaluated prior to induction Oxygen Delivery Method: Circle system utilized Preoxygenation: Pre-oxygenation with 100% oxygen Induction Type: IV induction Ventilation: Mask ventilation without difficulty Laryngoscope Size: Mac and 3 Grade View: Grade I Tube type: Oral Tube size: 7.0 mm Number of attempts: 1 Airway Equipment and Method: Stylet and Oral airway Placement Confirmation: ETT inserted through vocal cords under direct vision,  positive ETCO2 and breath sounds checked- equal and bilateral Secured at: 21 cm Tube secured with: Tape Dental Injury: Teeth and Oropharynx as per pre-operative assessment

## 2020-09-03 NOTE — Transfer of Care (Signed)
Immediate Anesthesia Transfer of Care Note  Patient: [name]  Procedure(s) Performed: LAPAROSCOPY DIAGNOSTIC  OVARIAN  LEFT CYSTECTOMY and DETORSION OF OVARY (N/A Abdomen)  Patient Location: PACU  Anesthesia Type:General  Level of Consciousness: awake and alert   Airway & Oxygen Therapy: Patient Spontanous Breathing and Patient connected to nasal cannula oxygen  Post-op Assessment: Report given to RN and Post -op Vital signs reviewed and stable  Post vital signs: Reviewed and stable  Last Vitals:  Vitals Value Taken Time  BP 109/63 09/03/20 1406  Temp    Pulse 106 09/03/20 1407  Resp 15 09/03/20 1407  SpO2 99 % 09/03/20 1407  Vitals shown include unvalidated device data.  Last Pain:  Vitals:   09/03/20 0844  TempSrc:   PainSc: 3          Complications: No complications documented.

## 2020-09-03 NOTE — Anesthesia Preprocedure Evaluation (Signed)
Anesthesia Evaluation  Patient identified by MRN, date of birth, ID band Patient awake    Reviewed: Allergy & Precautions, H&P , NPO status , Patient's Chart, lab work & pertinent test results  Airway Mallampati: II  TM Distance: >3 FB Neck ROM: Full    Dental no notable dental hx. (+) Dental Advisory Given   Pulmonary neg pulmonary ROS,    Pulmonary exam normal        Cardiovascular negative cardio ROS Normal cardiovascular exam     Neuro/Psych negative neurological ROS  negative psych ROS   GI/Hepatic negative GI ROS, Neg liver ROS,   Endo/Other  negative endocrine ROS  Renal/GU negative Renal ROS  negative genitourinary   Musculoskeletal negative musculoskeletal ROS (+)   Abdominal   Peds negative pediatric ROS (+)  Hematology negative hematology ROS (+)   Anesthesia Other Findings   Reproductive/Obstetrics                             Anesthesia Physical Anesthesia Plan  ASA: II and emergent  Anesthesia Plan: General   Post-op Pain Management:    Induction: Intravenous and Rapid sequence  PONV Risk Score and Plan: 4 or greater and Ondansetron, Dexamethasone, Midazolam and Scopolamine patch - Pre-op  Airway Management Planned: Oral ETT  Additional Equipment:   Intra-op Plan:   Post-operative Plan: Extubation in OR  Informed Consent: I have reviewed the patients History and Physical, chart, labs and discussed the procedure including the risks, benefits and alternatives for the proposed anesthesia with the patient or authorized representative who has indicated his/her understanding and acceptance.     Dental advisory given  Plan Discussed with: Anesthesiologist  Anesthesia Plan Comments:         Anesthesia Quick Evaluation

## 2020-09-03 NOTE — Anesthesia Postprocedure Evaluation (Signed)
Anesthesia Post Note  Patient: Sharon Roberts  Procedure(s) Performed: LAPAROSCOPY DIAGNOSTIC  OVARIAN  LEFT CYSTECTOMY and DETORSION OF OVARY (N/A Abdomen)     Patient location during evaluation: PACU Anesthesia Type: General Level of consciousness: sedated Pain management: pain level controlled Vital Signs Assessment: post-procedure vital signs reviewed and stable Respiratory status: spontaneous breathing and respiratory function stable Cardiovascular status: stable Postop Assessment: no apparent nausea or vomiting Anesthetic complications: no   No complications documented.  Last Vitals:  Vitals:   09/03/20 1420 09/03/20 1437  BP: 103/63 (!) 99/53  Pulse: (!) 105 95  Resp: 14 19  Temp:  36.6 C  SpO2: 96% 99%    Last Pain:  Vitals:   09/03/20 1437  TempSrc:   PainSc: 4                  [name] DANIEL

## 2020-09-03 NOTE — Discharge Instructions (Addendum)
Minimally Invasive Ovarian Torsion Surgery, Care After This sheet gives you information about how to care for yourself after your procedure. Your health care provider may also give you more specific instructions. If you have problems or questions, contact your health care provider. What can I expect after the procedure? After the procedure, it is common to have:  Mild cramping in your lower abdomen.  Tiredness (fatigue). You may feel tired for a few days. Follow these instructions at home: Incision care  Follow instructions from your health care provider about how to take care of your incisions. Make sure you: ? Wash your hands with soap and water before and after you change your bandage (dressing). If soap and water are not available, use hand sanitizer. ? Change your dressing as told by your health care provider. ? Leave stitches (sutures), skin glue, or adhesive strips in place. These skin closures may need to stay in place for 2 weeks or longer. If adhesive strip edges start to loosen and curl up, you may trim the loose edges. Do not remove adhesive strips completely unless your health care provider tells you to do that.  Check your incision areas every day for signs of infection. Check for: ? Redness, swelling, or pain that gets worse. ? Fluid or blood. ? Warmth. ? Pus or a bad smell.  Keep your incision areas clean and dry.  Do not take baths, swim, or use a hot tub until your health care provider approves. Ask your health care provider if you may take showers. You may only be allowed to take sponge baths.   Activity  Return to your normal activities as told by your health care provider. Ask your health care provider what activities are safe for you. Ask about: ? Returning to work and exercise. ? Any weight restrictions you may have. ? When you can resume sexual activity.  Rest as told by your health care provider.  Avoid sitting for a long time without moving. Get up to take  short walks every 1-2 hours. This is important to improve blood flow and breathing. Ask for help if you feel weak or unsteady. General instructions  Take over-the-counter and prescription medicines only as told by your health care provider.  Ask your health care provider if the medicine prescribed to you: ? Requires you to avoid driving or using heavy machinery. ? Can cause constipation. You may need to take these actions to prevent or treat constipation:  Drink enough fluid to keep your urine pale yellow.  Take over-the-counter or prescription medicines.  Eat foods that are high in fiber, such as beans, whole grains, and fresh fruits and vegetables.  Limit foods that are high in fat and processed sugars, such as fried or sweet foods.  Keep all follow-up visits as told by your health care provider. This is important. Contact a health care provider if:  You have unusual bleeding or discharge from your vagina.  You have pain in your abdomen that gets worse or does not get better with medicine.  You feel nauseous.  You have redness, swelling, or pain around any of your incisions.  You have fluid or blood coming from any of your incisions.  Any of your incisions feel warm to the touch.  You have pus or a bad smell coming from any of your incisions. Get help right away if:  You have a fever.  You have a lot of unusual bleeding or discharge from your vagina.  You have severe  pain in your abdomen.  You cannot stop vomiting.  You have nausea that does not get better.  You faint.  You are unable to empty your bladder. Summary  After the procedure, it is common to have mild cramping and to feel tired for a few days.  Return to your normal activities as told by your health care provider. Ask your health care provider what activities are safe for you.  Contact a health care provider if you have complications, such as unusual bleeding or discharge, nausea, pain that gets  worse, or fluid or blood coming from any of your incisions.  Keep all follow-up visits as told by your health care provider. This information is not intended to replace advice given to you by your health care provider. Make sure you discuss any questions you have with your health care provider. Document Revised: 10/09/2018 Document Reviewed: 10/09/2018 Elsevier Patient Education  2021 Elsevier Inc.    Diagnostic Laparoscopy, Care After The following information offers guidance on how to care for yourself after your procedure. Your health care provider may also give you more specific instructions. If you have problems or questions, contact your health care provider. What can I expect after the procedure? After the procedure, it is common to have:  Mild discomfort in the abdomen.  Sore throat.  Some shoulder pain. Women who have laparoscopy with a pelvic examination may have mild cramping and fluid coming from the vagina for a few days after the procedure. Follow these instructions at home: Medicines  Take over-the-counter and prescription medicines only as told by your health care provider.  If you were prescribed an antibiotic medicine, take it as told by your health care provider. Do not stop taking the antibiotic even if you start to feel better.  Ask your health care provider if the medicine prescribed to you: ? Requires you to avoid driving or using machinery. ? Can cause constipation. You may need to take these actions to prevent or treat constipation:  Drink enough fluid to keep your urine pale yellow.  Take over-the-counter or prescription medicines.  Eat foods that are high in fiber, such as beans, whole grains, and fresh fruits and vegetables.  Limit foods that are high in fat and processed sugars, such as fried or sweet foods. Incision care  Follow instructions from your health care provider about how to take care of your incisions. Make sure you: ? Wash your hands  with soap and water for at least 20 seconds before and after you change your bandage (dressing). If soap and water are not available, use hand sanitizer. ? Change your dressing as told by your health care provider. ? Leave stitches (sutures), skin glue, or surgical tape in place. These skin closures may need to stay in place for 2 weeks or longer. If surgical tape edges start to loosen and curl up, you may trim the loose edges. Do not remove the surgical tape completely unless your health care provider tells you to do that.  Check your incision areas every day for signs of infection. Check for: ? Redness, swelling, or pain. ? Fluid or blood. ? Warmth. ? Pus or a bad smell.   Activity  Return to your normal activities as told by your health care provider. Ask your health care provider what activities are safe for you.  Do not lift anything that is heavier than 10 lb (4.5 kg), or the limit that you are told, until your health care provider says that it  is safe.  Avoid sitting for a long time without moving. Get up to take short walks every 1-2 hours. This is important to improve blood flow and breathing. Ask for help if you feel weak or unsteady. General instructions  Do not use any products that contain nicotine or tobacco. These products include cigarettes, chewing tobacco, and vaping devices, such as e-cigarettes. If you need help quitting, ask your health care provider.  If you were given a sedative during the procedure, it can affect you for several hours. Do not drive or operate machinery until your health care provider says that it is safe.  Do not take baths, swim, or use a hot tub until your health care provider approves. Ask your health care provider if you may take showers. You may only be allowed to take sponge baths.  Keep all follow-up visits. This is important. Contact a health care provider if:  You feel light-headed or faint.  You are unable to pass gas or have a bowel  movement.  You feel nauseous or you vomit.  You develop a rash.  You have any of these signs of infection: ? Redness, swelling, or pain around an incision. ? Fluid or blood coming from an incision. ? Warmth coming from an incision. ? Pus or a bad smell coming from an incision. ? A fever or chills. Get help right away if:  You have severe pain.  You have vomiting that does not go away.  You have heavy bleeding from the vagina.  Any incision opens up.  You have trouble breathing.  You have chest pain. These symptoms may represent a serious problem that is an emergency. Do not wait to see if the symptoms will go away. Get medical help right away. Call your local emergency services (911 in the [place] Do not drive yourself to the hospital. Summary  After the procedure, it is common to have mild discomfort in the abdomen and a sore throat.  Check your incision areas every day for signs of infection.  Return to your normal activities as told by your health care provider. Ask your health care provider what activities are safe for you. This information is not intended to replace advice given to you by your health care provider. Make sure you discuss any questions you have with your health care provider. Document Revised: 03/19/2020 Document Reviewed: 03/19/2020 Elsevier Patient Education  2021 Elsevier Inc.   Please sign up for MyChart Thank you for enrolling in MyChart. Please follow the instructions below to securely access your online medical record. MyChart allows you to send messages to your doctor, view your test results, manage appointments, and more.   How Do I Sign Up? 1. In your Internet browser, go to [employer] and enter [email or web address]. 2. Click on the Sign Up Now link in the Sign In box. You will see the New Member Sign Up page. 3. Enter your MyChart Access Code exactly as it appears below. You will not need to use this code after youve completed  the sign-up process. If you do not sign up before the expiration date, you must request a new code.  MyChart Access Code: [email or web address] Expires: 10/18/2020 10:40 AM  4. Enter your Social Security Number ([id number]) and Date of Birth (mm/dd/yyyy) as indicated and click Submit. You will be taken to the next sign-up page. 5. Create a MyChart ID. This will be your MyChart login ID and cannot be changed, so think of one that is secure and  easy to remember. 6. Create a MyChart password. You can change your password at any time. 7. Enter your Password Reset Question and Answer. This can be used at a later time if you forget your password.  8. Enter your e-mail address. You will receive e-mail notification when new information is available in MyChart. 9. Click Sign Up. You can now view your medical record.   Additional Information Remember, MyChart is NOT to be used for urgent needs. For medical emergencies, dial 911.

## 2020-09-03 NOTE — Op Note (Signed)
[name] PROCEDURE DATE: 09/03/2020  PREOPERATIVE DIAGNOSES: Ovarian cyst,severe pain and concern about intermittent torsion POSTOPERATIVE DIAGNOSES: Torsion of left ovary, ovarian pedicle and fallopian tube; 6 cm ruptured, left ovarian hemorrhagic cyst PROCEDURE: Laparoscopic left ovarian cystectomy and detorsion of left adnexa SURGEON:  Dr. Jaynie Collins ANESTHESIOLOGY TEAM: Anesthesiologist: Heather Roberts, MD CRNA: Epifanio Lesches, CRNA; Lelon Perla, CRNA  INDICATIONS: 25 y.o. [phone] with aforementioned preoperative diagnoses here today for surgical management.   Risks of surgery were discussed with the patient including but not limited to: bleeding which may require transfusion or reoperation; infection which may require antibiotics; injury to bowel, bladder, uterus or other surrounding organs; need for additional procedures including laparotomy or subsequent procedures secondary to abnormal pathology; thromboembolic phenomenon, incisional problems and other postoperative/anesthesia complications. Written informed consent was obtained.    FINDINGS:  Left adnexa was noted to be torsed once around the left infundibulopelvic ligament. Left adnexa remarkable for enlarged left ovary with 6 cm residual hemorrhagic cyst after preceding rupture of another cyst.  Left fallopian tube also torsed around the left infundibulopelvic ligament.  Moderate bloody fluid in pelvis and right upper abdomen.  Normal right adnexa.  Small normal appearing uterus.  Normal upper abdomen.  ANESTHESIA:    General ESTIMATED BLOOD LOSS: 20 ml SPECIMENS:  Left ovarian cyst wall COMPLICATIONS: None immediate   PROCEDURE IN DETAIL:  The patient had sequential compression devices applied to her lower extremities while in the preoperative area.  She was then taken to the operating room where general anesthesia was administered and was found to be adequate.  She was placed in the dorsal lithotomy position, and was  prepped and draped in a sterile manner.  A Foley catheter was inserted into her bladder and attached to constant drainage and a uterine manipulator was then advanced into the uterus . After an adequate timeout was performed, attention was then turned to the patient's abdomen where a 5-mm skin incision was made in the umbilical fold.  The Optiview 5-mm trocar and sleeve were then advanced without difficulty with the laparoscope under direct visualization into the abdomen.  The abdomen was then insufflated with carbon dioxide gas.  Adequate pneumoperitoneum was obtained.  A survey of the patient's pelvis and abdomen revealed the findings above. Bilateral 5-mm lower quadrant ports were then placed under direct visualization. The left adnexa (ovary and fallopian tube) was detorsed from the infundibulopelvic ligament.  The left ovary was noted to have a surface disruption about 2 cm in length with blood clots present.  This was consistent with preceding rupture of another hemorrhagic cyst. There was another 6 cm cyst seen proximal to this area, this was ruptured for more blood fluid. The bloody fluid was suctioned using the Nezhat apparatus.  The cyst wall was then grasped and removed. Unable to remove a portion of the cyst wall as it was adherent to the adherent to the ovary. The area was irrigated, and scant bleeding was noted from the cyst defect.  Arista was applied to this area to help with hemostasis.  No intraoperative injury to other surrounding organs was noted.  The abdomen was desufflated and all instruments were then removed from the patient's abdomen.  All skin incisions were closed with 3-0 Vicryl subcuticular stitches and Dermabond.  The patient tolerated the procedure well.  Sponge, lap, and needle counts were correct times two.  The patient was then taken to the recovery room awake, extubated and in stable condition.  The patient will  be discharged to home as per PACU criteria.  Routine postoperative  instructions given.  She was prescribed Oxycodone, Ibuprofen and Colace.  She will follow up in the office in 2-3 weeks for postoperative evaluation.   Jaynie Collins, MD, FACOG Obstetrician & Gynecologist, [facility] for [employer], [facility]

## 2020-09-03 NOTE — H&P (Signed)
Preoperative History and Physical  Sharon Roberts is a 25 y.o. [phone] here for surgical evaluation and management of ovarian cyst,severe pain and concern about intermittent torsion  Was evaluated at [facility] ER, found to be hemodynamically stable. After discussion with her ER team and patient and family, the decision was made to transfe her to [facility] for further management.   No significant preoperative concerns.  Proposed surgery: Operative laparoscopy, possible ovarian cystectomy, possible removal of ovary  Past Medical History:  Diagnosis Date  . Anemia   . Anemia 09/03/2020  . Anxiety   . Asthma   . Depression    Past Surgical History:  Procedure Laterality Date  . NO PAST SURGERIES     OB History  Gravida Para Term Preterm AB Living  3 3 3     3   SAB IAB Ectopic Multiple Live Births        0 3    # Outcome Date GA Lbr Len/2nd Weight Sex Delivery Anes PTL Lv  3 Term 11/03/19 [redacted]w[redacted]d / 00:05 3430 g M Vag-Spont EPI  LIV  2 Term      Vag-Spont   LIV  1 Term      Vag-Spont   LIV  Patient denies any other pertinent gynecologic issues.   No current facility-administered medications on file prior to encounter.   Current Outpatient Medications on File Prior to Encounter  Medication Sig Dispense Refill  . acetaminophen (TYLENOL) 325 MG tablet Take 2 tablets (650 mg total) by mouth every 4 (four) hours as needed (for pain scale < 4). (Patient taking differently: Take 650 mg by mouth every 4 (four) hours as needed for headache.) 30 tablet 0  . docusate sodium (COLACE) 100 MG capsule Take 1 capsule (100 mg total) by mouth 2 (two) times daily. (Patient not taking: No sig reported) 10 capsule 0  . ferrous sulfate 325 (65 FE) MG tablet Take 1 tablet (325 mg total) by mouth 2 (two) times daily with a meal. (Patient not taking: No sig reported) 60 tablet 1  . ibuprofen (ADVIL) 600 MG tablet Take 1 tablet (600 mg total) by mouth every 6 (six) hours. (Patient not taking: No sig reported) 30  tablet 0   Allergies  Allergen Reactions  . Amoxicillin Shortness Of Breath and Nausea And Vomiting  . Cinnamon Anaphylaxis  . Penicillins Shortness Of Breath and Nausea And Vomiting    Social History:   reports that she has never smoked. She has never used smokeless tobacco. She reports that she does not drink alcohol and does not use drugs.  Family History  Problem Relation Age of Onset  . Neuropathy Mother   . COPD Mother   . Fibromyalgia Mother   . Hypertension Father   . Healthy Sister   . Healthy Brother   . Healthy Sister     Review of Systems: Pertinent items noted in HPI and remainder of comprehensive ROS otherwise negative.  PHYSICAL EXAM: Blood pressure 100/64, pulse 76, temperature 98.5 F (36.9 C), temperature source Oral, resp. rate 18, height 5\' 4"  (1.626 m), weight 83.5 kg, SpO2 96 %, unknown if currently breastfeeding. CONSTITUTIONAL: Well-developed, well-nourished female in no acute distress.  HENT:  Normocephalic, atraumatic, External right and left ear normal. Oropharynx is clear and moist EYES: Conjunctivae and EOM are normal. Pupils are equal, round, and reactive to light. No scleral icterus.  NECK: Normal range of motion, supple, no masses SKIN: Skin is warm and dry. No rash  noted. Not diaphoretic. No erythema. No pallor. NEUROLOGIC: Alert and oriented to person, place, and time. Normal reflexes, muscle tone coordination. No cranial nerve deficit noted. PSYCHIATRIC: Normal mood and affect. Normal behavior. Normal judgment and thought content. CARDIOVASCULAR: Normal heart rate noted, regular rhythm RESPIRATORY: Effort and breath sounds normal, no problems with respiration noted ABDOMEN: Soft, moderate tenderness to palpation in lower abdomen, nondistended. PELVIC: Deferred MUSCULOSKELETAL: Normal range of motion. No edema and no tenderness. 2+ distal pulses.  Labs: Results for orders placed or performed during the hospital encounter of 09/03/20 (from the  past 336 hour(s))  Wet prep, genital   Collection Time: 09/03/20  7:36 AM   Specimen: PATH Cytology Cervicovaginal Ancillary Only  Result Value Ref Range   Yeast Wet Prep HPF POC NONE SEEN NONE SEEN   Trich, Wet Prep NONE SEEN NONE SEEN   Clue Cells Wet Prep HPF POC NONE SEEN NONE SEEN   WBC, Wet Prep HPF POC MANY (A) NONE SEEN   Sperm NONE SEEN   CBC with Differential/Platelet   Collection Time: 09/03/20  7:36 AM  Result Value Ref Range   WBC 9.1 4.0 - 10.5 K/uL   RBC 4.40 3.87 - 5.11 MIL/uL   Hemoglobin 13.7 12.0 - 15.0 g/dL   HCT 40.8 14.4 - 81.8 %   MCV 91.4 80.0 - 100.0 fL   MCH 31.1 26.0 - 34.0 pg   MCHC 34.1 30.0 - 36.0 g/dL   RDW 56.3 14.9 - 70.2 %   Platelets 256 150 - 400 K/uL   nRBC 0.0 0.0 - 0.2 %   Neutrophils Relative % 72 %   Neutro Abs 6.5 1.7 - 7.7 K/uL   Lymphocytes Relative 20 %   Lymphs Abs 1.8 0.7 - 4.0 K/uL   Monocytes Relative 6 %   Monocytes Absolute 0.5 0.1 - 1.0 K/uL   Eosinophils Relative 1 %   Eosinophils Absolute 0.1 0.0 - 0.5 K/uL   Basophils Relative 1 %   Basophils Absolute 0.1 0.0 - 0.1 K/uL   Immature Granulocytes 0 %   Abs Immature Granulocytes 0.04 0.00 - 0.07 K/uL  Comprehensive metabolic panel   Collection Time: 09/03/20  7:36 AM  Result Value Ref Range   Sodium 138 135 - 145 mmol/L   Potassium 3.6 3.5 - 5.1 mmol/L   Chloride 104 98 - 111 mmol/L   CO2 24 22 - 32 mmol/L   Glucose, Bld 97 70 - 99 mg/dL   BUN 13 6 - 20 mg/dL   Creatinine, Ser 6.37 0.44 - 1.00 mg/dL   Calcium 9.0 8.9 - 85.8 mg/dL   Total Protein 7.1 6.5 - 8.1 g/dL   Albumin 4.2 3.5 - 5.0 g/dL   AST 18 15 - 41 U/L   ALT 17 0 - 44 U/L   Alkaline Phosphatase 47 38 - 126 U/L   Total Bilirubin 0.5 0.3 - 1.2 mg/dL   GFR, Estimated >85 >02 mL/min   Anion gap 10 5 - 15  Lipase, blood   Collection Time: 09/03/20  7:36 AM  Result Value Ref Range   Lipase 34 11 - 51 U/L  Urinalysis, Routine w reflex microscopic Urine, Clean Catch   Collection Time: 09/03/20  8:47 AM   Result Value Ref Range   Color, Urine YELLOW YELLOW   APPearance HAZY (A) CLEAR   Specific Gravity, Urine 1.026 1.005 - 1.030   pH 5.0 5.0 - 8.0   Glucose, UA NEGATIVE NEGATIVE mg/dL   Hgb urine dipstick  NEGATIVE NEGATIVE   Bilirubin Urine NEGATIVE NEGATIVE   Ketones, ur NEGATIVE NEGATIVE mg/dL   Protein, ur NEGATIVE NEGATIVE mg/dL   Nitrite NEGATIVE NEGATIVE   Leukocytes,Ua NEGATIVE NEGATIVE  Pregnancy, urine   Collection Time: 09/03/20  8:47 AM  Result Value Ref Range   Preg Test, Ur NEGATIVE NEGATIVE  SARS Coronavirus 2 by RT PCR (hospital order, performed in [facility] hospital lab) Nasopharyngeal Nasopharyngeal Swab   Collection Time: 09/03/20 10:12 AM   Specimen: Nasopharyngeal Swab  Result Value Ref Range   SARS Coronavirus 2 NEGATIVE NEGATIVE    Imaging Studies: US PELVIC COMPLETE W TRANSVAGINAL AND TORSION R/O  Result Date: 09/03/2020 CLINICAL DATA:  Pelvic pain EXAM: TRANSABDOMINAL AND TRANSVAGINAL ULTRASOUND OF PELVIS DOPPLER ULTRASOUND OF OVARIES TECHNIQUE: Study was performed transabdominally to optimize pelvic field of view evaluation and transvaginally to optimize internal visceral architecture evaluation. Color and duplex Doppler ultrasound was utilized to evaluate blood flow to the ovaries. COMPARISON:  May 22, 2014 FINDINGS: Uterus Measurements: 10.1 x 5.8 x 7.5 cm = volume: 229.8 mL. No fibroids or other mass visualized. Endometrium Thickness: 3 mm. Intrauterine device present within the endometrium. No focal abnormality visualized. Right ovary Measurements: 3.5 x 2.2 x 1.3 cm = volume: 5.2 mL. Normal appearance/no adnexal mass. Left ovary Measurements: 5.1 x 5.1 x 6.7 cm. = volume: 90.9 mL. There is a cyst arising from the right ovary measuring 5.3 x 4.4 x 5.5 cm. Pulsed Doppler evaluation of both ovaries demonstrates normal low-resistance arterial and venous waveforms. There appears to be somewhat diminished flow in the right ovary compared to the left side.  Other findings: Moderate free pelvic fluid noted. IMPRESSION: 1. Doppler flow is seen in each ovary. Note that flow in the right ovary appears somewhat diminished compared to the left side. Clinical significance of this finding is uncertain. This finding potentially could be indicative of a degree of intermittent ovarian torsion. Close clinical surveillance in this regard is felt to be warranted. 2. There is a cyst arising in the left adnexa measuring 5.3 x 4.4 x 5.5 cm. No other extrauterine pelvic mass. Recommend follow-up US in 3-6 months per consensus guidelines. Note: This recommendation does not apply to premenarchal patients or to those with increased risk (genetic, family history, elevated tumor markers or other high-risk factors) of ovarian cancer. Reference: Radiology 2019 Nov; 293(2):359-371. 3. Moderate free fluid in the cul-de-sac may be due to recent ovarian cyst leakage. 4. Intrauterine device within the endometrium. No endometrial thickening. 5.  No intrauterine mass. Electronically Signed   By: Bretta Bang III M.D.   On: 09/03/2020 08:51    Assessment: Principal Problem:   Ovarian cyst Active Problems:   Acute pelvic pain and concern about ovarian torsion   Plan: Patient will undergo surgical management with operative laparoscopy, possible ovarian cystectomy, possible removal of ovary.  The risks of surgery were discussed in detail with the patient including but not limited to: bleeding which may require transfusion or reoperation; infection which may require antibiotics; injury to surrounding organs which may involve bowel, bladder, uterus; need for additional procedures including laparotomy or subsequent procedures secondary to abnormal pathology; thromboembolic phenomenon, surgical site problems and other postoperative/anesthesia complications. Likelihood of success in alleviating the patient's condition was discussed. Routine postoperative instructions will be reviewed with the  patient and her family in detail after surgery.  The patient concurred with the proposed plan, giving informed written consent for the surgery.  Patient has been NPO since last night and  she will remain NPO for procedure.  Anesthesia and OR aware.  Preoperative SCDs ordered on call to the OR.  To OR when ready.    Jaynie Collins, MD, FACOG Obstetrician & Gynecologist, [facility] for [employer], [facility]

## 2020-09-03 NOTE — ED Triage Notes (Signed)
Pt BIB [facility] for severe, sudden onset generalized abdominal pain that woke her from sleep. Hx ruptured ovarian cysts, not this severe before. Known unchanged cyst on left ovary in the last year. Abdomen soft, more tender on lower right. Denies NVD. IUD in place ~7 months. No known sick contacts.  BP 138/60 HR 98 SpO2 97% RA RR 20

## 2020-09-04 ENCOUNTER — Encounter (HOSPITAL_COMMUNITY): Payer: Self-pay | Admitting: Obstetrics & Gynecology

## 2020-09-04 LAB — RPR: RPR Ser Ql: NONREACTIVE

## 2020-09-06 ENCOUNTER — Telehealth: Payer: Self-pay | Admitting: Radiology

## 2020-09-06 LAB — GC/CHLAMYDIA PROBE AMP (~~LOC~~) NOT AT ARMC
Chlamydia: NEGATIVE
Comment: NEGATIVE
Comment: NORMAL
Neisseria Gonorrhea: NEGATIVE

## 2020-09-06 LAB — SURGICAL PATHOLOGY

## 2020-09-06 NOTE — Telephone Encounter (Signed)
Called and left patient a voice mail of postop appointment scheduled with Dr Macon Large on 07/20/21 @ 1:45.

## 2020-09-20 ENCOUNTER — Ambulatory Visit (INDEPENDENT_AMBULATORY_CARE_PROVIDER_SITE_OTHER): Payer: 59 | Admitting: Obstetrics & Gynecology

## 2020-09-20 ENCOUNTER — Other Ambulatory Visit: Payer: Self-pay

## 2020-09-20 ENCOUNTER — Encounter: Payer: Self-pay | Admitting: Obstetrics & Gynecology

## 2020-09-20 VITALS — BP 105/70 | HR 72 | Ht 65.0 in | Wt 185.6 lb

## 2020-09-20 DIAGNOSIS — Z09 Encounter for follow-up examination after completed treatment for conditions other than malignant neoplasm: Secondary | ICD-10-CM

## 2020-09-20 NOTE — Progress Notes (Signed)
    GYNECOLOGY POSTOPERATIVE VISIT   Subjective:     Sharon Roberts is a 25 y.o. female who presents to the clinic 2 weeks status post laparoscopic left ovarian cystectomy and detorsion of left adnexa. Eating a regular diet without difficulty. Bowel movements are normal. Pain is controlled without any medications.  The following portions of the patient's history were reviewed and updated as appropriate: allergies, current medications, past family history, past medical history, past social history, past surgical history and problem list.  Normal pap smear in 2021 as per patient.  Review of Systems Pertinent items noted in HPI and remainder of comprehensive ROS otherwise negative.    Objective:    BP 105/70   Pulse 72   Ht 5\' 5"  (1.651 m)   Wt 185 lb 9.6 oz (84.2 kg)   BMI 30.89 kg/m  General:  alert and no distress  Abdomen: soft, bowel sounds active, non-tender  Incision:   healing well, no drainage, no erythema, no hernia, no seroma, no swelling, no dehiscence, incision well approximated   09/03/2020 Surgical Pathology OVARIAN CYST WALL, LEFT, CYSTECTOMY:  - Fragments of ovarian follicular cyst wall and abundant blood.  - No endometriosis or malignancy.     Assessment:    Doing well postoperatively. Operative findings again reviewed. Pathology report discussed.    Plan:    1. Continue any current medications. 2. Wound care discussed. 3. Activity restrictions: none 4. Follow up as needed with 09/05/2020 or primary GYN provider.    [place], MD, FACOG Obstetrician & Gynecologist, [facility] for [facility]

## 2020-09-29 ENCOUNTER — Ambulatory Visit (INDEPENDENT_AMBULATORY_CARE_PROVIDER_SITE_OTHER): Payer: 59 | Admitting: *Deleted

## 2020-09-29 ENCOUNTER — Other Ambulatory Visit: Payer: Self-pay

## 2020-09-29 VITALS — BP 101/66 | HR 76

## 2020-09-29 DIAGNOSIS — Z09 Encounter for follow-up examination after completed treatment for conditions other than malignant neoplasm: Secondary | ICD-10-CM

## 2020-09-29 NOTE — Progress Notes (Signed)
I saw this patient clipped a suture that was protruding through the skin.

## 2020-09-29 NOTE — Progress Notes (Signed)
Attestation of Attending Supervision of clinical support staff: I agree with the care provided to this patient and was available for any consultation.  I have reviewed the RN's note and chart. I was available for consult and to see the patient if needed.   Kimberly Niles Newton, MD, MPH, ABFM Attending Physician Faculty [facility]  

## 2020-09-29 NOTE — Progress Notes (Signed)
Subjective:     Sharon Roberts is a 25 y.o. female who presents to the clinic to recheck her incisions. Pt concerned they may be infected.     Objective:    BP 101/66   Pulse 76  General:  alert     Incision:   healing well, no drainage, no erythema, no hernia, no seroma, no swelling, no dehiscence, incision well approximated. Pt had a vicryl suture that being expelled, removed By Dr Alvester Morin.      Assessment:    Doing well postoperatively.   Plan:    1. Wound care discussed. 5. Follow up:  As needed Scheryl Marten, RN

## 2022-02-20 IMAGING — US US PELVIS COMPLETE TRANSABD/TRANSVAG W DUPLEX
1 series · 13 of 25 positions shown · non-contrast
Comparison: May 22, 2014

CLINICAL DATA: Pelvic pain

EXAM:
TRANSABDOMINAL AND TRANSVAGINAL ULTRASOUND OF PELVIS
DOPPLER ULTRASOUND OF OVARIES
TECHNIQUE: Study was performed transabdominally to optimize pelvic field of
view evaluation and transvaginally to optimize internal visceral
architecture evaluation.
Color and duplex Doppler ultrasound was utilized to evaluate blood
flow to the ovaries.

[Series 1: us pelvis complete transabd/transvag w duplex · 13 of 107 slices shown]
[im 1/107]
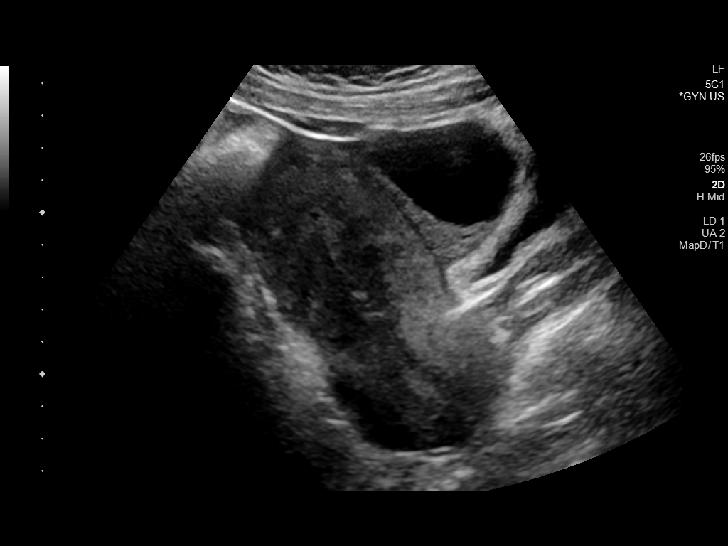
[im 9/107]
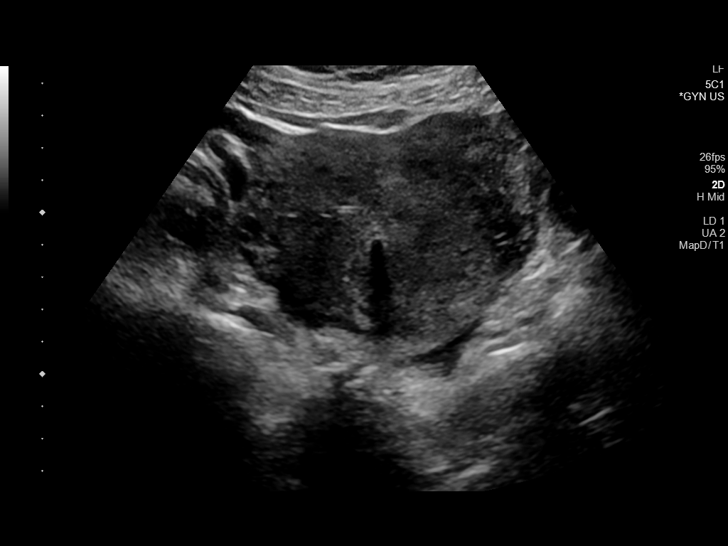
[im 18/107]
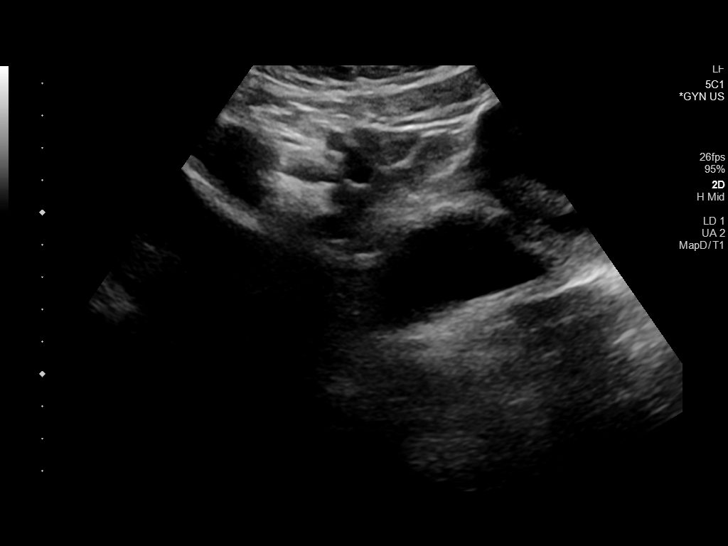
[im 27/107]
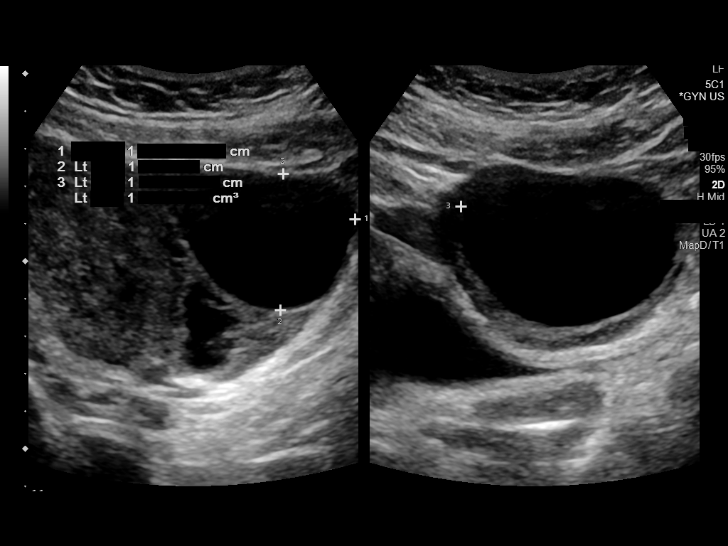
[im 36/107]
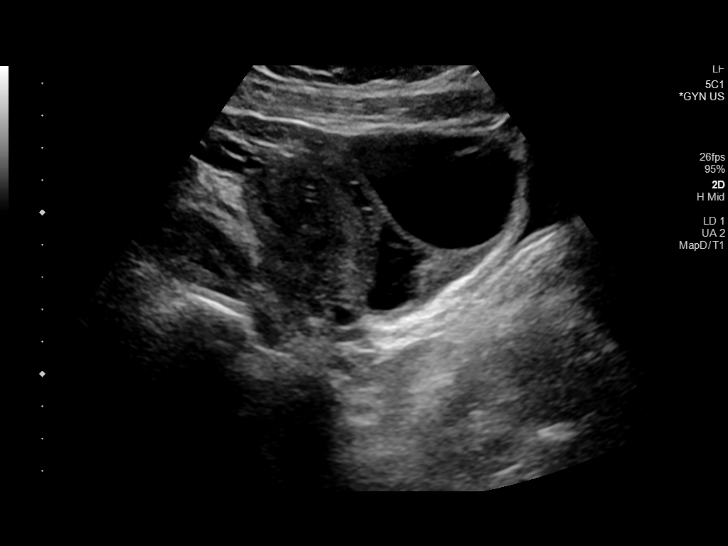
[im 45/107]
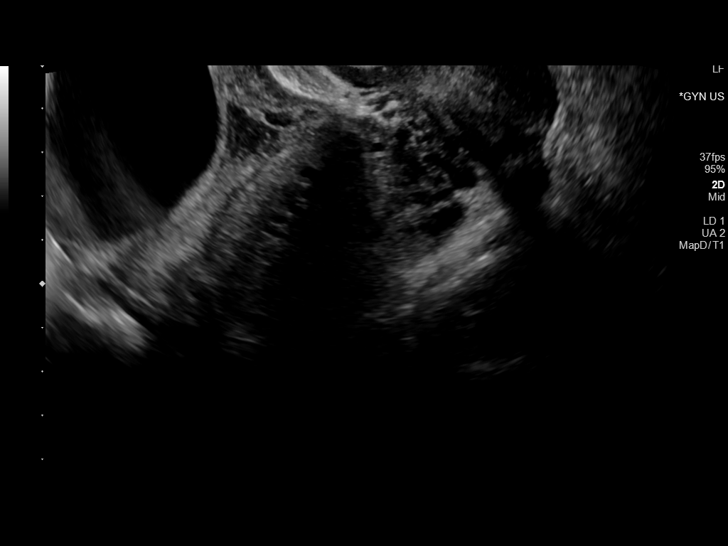
[im 54/107]
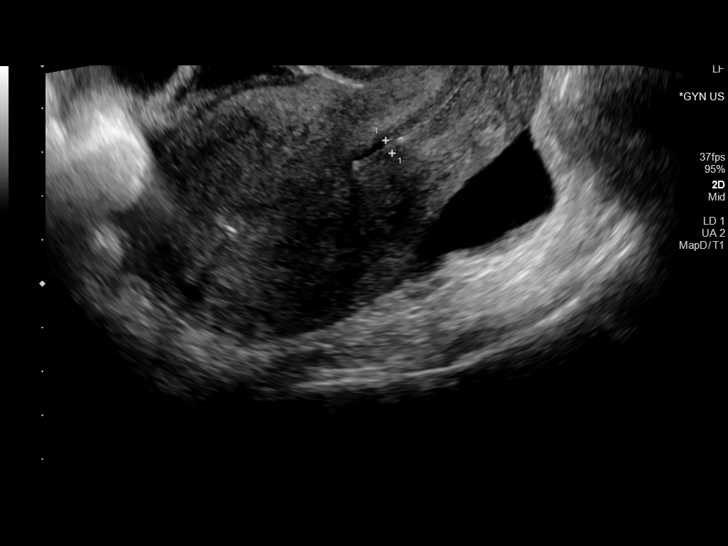
[im 62/107]
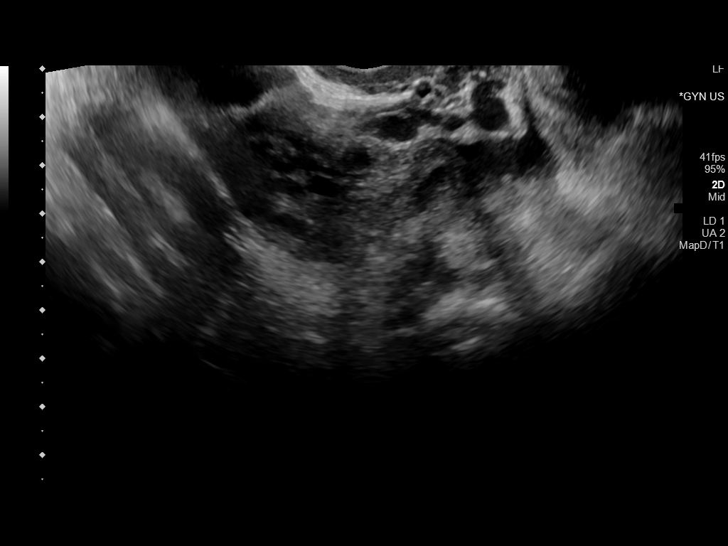
[im 71/107]
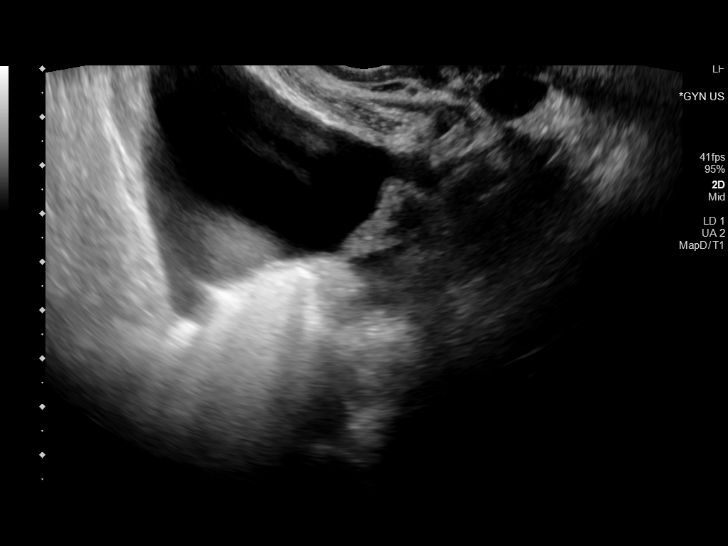
[im 80/107]
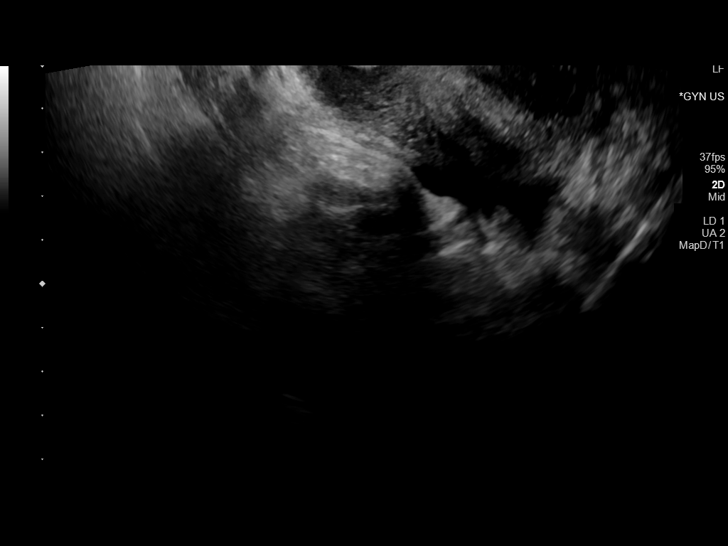
[im 89/107]
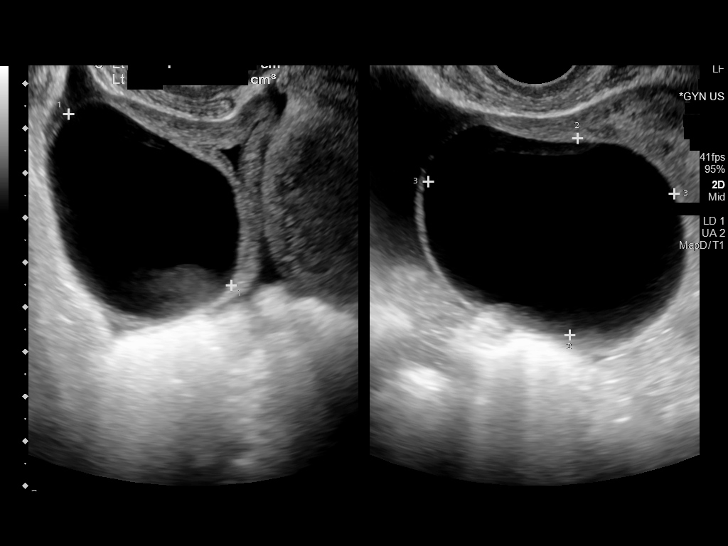
[im 98/107]
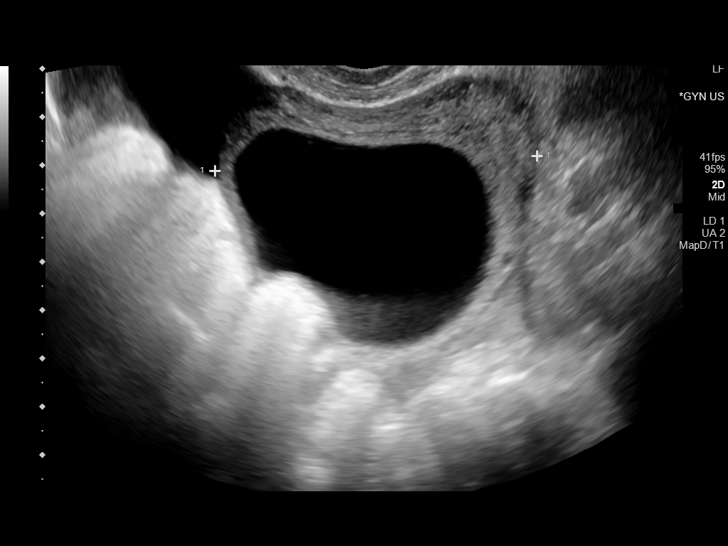
[im 107/107]
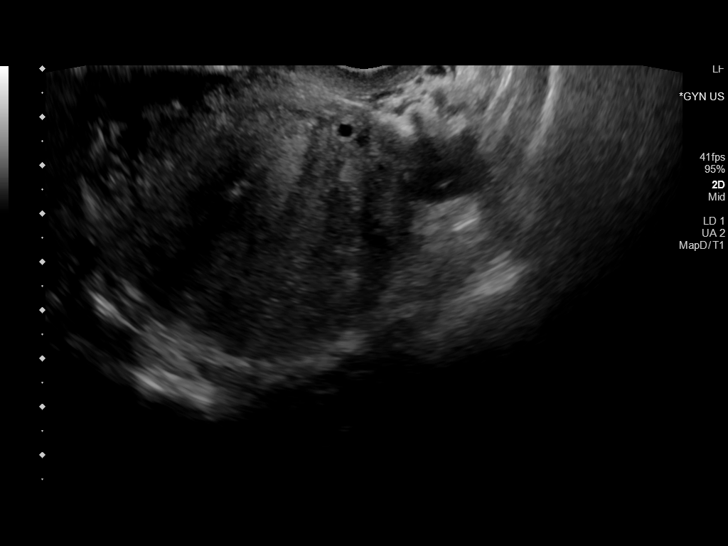

[13 of 25 positions shown; findings below may reference images not displayed]

FINDINGS: Uterus

Measurements: 10.1 x 5.8 x 7.5 cm = volume: 229.8 mL. No fibroids or
other mass visualized.

Endometrium

Thickness: 3 mm. Intrauterine device present within the endometrium.
No focal abnormality visualized.

Right ovary

Measurements: 3.5 x 2.2 x 1.3 cm = volume: 5.2 mL. Normal
appearance/no adnexal mass.

Left ovary

Measurements: 5.1 x 5.1 x 6.7 cm. = volume: 90.9 mL. There is a cyst
arising from the right ovary measuring 5.3 x 4.4 x 5.5 cm.

Pulsed Doppler evaluation of both ovaries demonstrates normal
low-resistance arterial and venous waveforms. There appears to be
somewhat diminished flow in the right ovary compared to the left
side.

Other findings: Moderate free pelvic fluid noted.
IMPRESSION: 1. Doppler flow is seen in each ovary. Note that flow in the right
ovary appears somewhat diminished compared to the left side.
Clinical significance of this finding is uncertain. This finding
potentially could be indicative of a degree of intermittent ovarian
torsion. Close clinical surveillance in this regard is felt to be
warranted.

2. There is a cyst arising in the left adnexa measuring 5.3 x 4.4 x
5.5 cm. No other extrauterine pelvic mass. Recommend follow-up US in
3-6 months per consensus guidelines. Note: This recommendation does
not apply to premenarchal patients or to those with increased risk
(genetic, family history, elevated tumor markers or other high-risk
factors) of ovarian cancer. Reference: Radiology [DATE]):359-371.

3. Moderate free fluid in the cul-de-sac may be due to recent
ovarian cyst leakage.

4. Intrauterine device within the endometrium. No endometrial
thickening.

5.  No intrauterine mass.

## 2022-12-10 ENCOUNTER — Encounter: Payer: Self-pay | Admitting: Oncology

## 2023-01-22 DIAGNOSIS — F32A Depression, unspecified: Secondary | ICD-10-CM | POA: Insufficient documentation

## 2023-03-09 ENCOUNTER — Emergency Department (HOSPITAL_COMMUNITY): Payer: Medicaid Other

## 2023-03-09 ENCOUNTER — Encounter (HOSPITAL_COMMUNITY): Payer: Self-pay

## 2023-03-09 ENCOUNTER — Other Ambulatory Visit: Payer: Self-pay

## 2023-03-09 ENCOUNTER — Emergency Department (HOSPITAL_COMMUNITY)
Admission: EM | Admit: 2023-03-09 | Discharge: 2023-03-09 | Disposition: A | Discharge status: Home / Self Care | Payer: Medicaid Other | Attending: Emergency Medicine | Admitting: Emergency Medicine

## 2023-03-09 ENCOUNTER — Encounter: Payer: Self-pay | Admitting: Oncology

## 2023-03-09 DIAGNOSIS — R1031 Right lower quadrant pain: Secondary | ICD-10-CM | POA: Diagnosis present

## 2023-03-09 DIAGNOSIS — N83209 Unspecified ovarian cyst, unspecified side: Secondary | ICD-10-CM

## 2023-03-09 DIAGNOSIS — N83299 Other ovarian cyst, unspecified side: Secondary | ICD-10-CM | POA: Diagnosis not present

## 2023-03-09 LAB — COMPREHENSIVE METABOLIC PANEL
ALT: 23 U/L (ref 0–44)
AST: 23 U/L (ref 15–41)
Albumin: 4 g/dL (ref 3.5–5.0)
Alkaline Phosphatase: 52 U/L (ref 38–126)
Anion gap: 8 (ref 5–15)
BUN: 13 mg/dL (ref 6–20)
CO2: 22 mmol/L (ref 22–32)
Calcium: 8.5 mg/dL — ABNORMAL LOW (ref 8.9–10.3)
Chloride: 107 mmol/L (ref 98–111)
Creatinine, Ser: 0.51 mg/dL (ref 0.44–1.00)
GFR, Estimated: >60 mL/min (ref >60)
Glucose, Bld: 94 mg/dL (ref 70–99)
Potassium: 3.2 mmol/L — ABNORMAL LOW (ref 3.5–5.1)
Sodium: 137 mmol/L (ref 135–145)
Total Bilirubin: 0.6 mg/dL (ref 0.3–1.2)
Total Protein: 7.2 g/dL (ref 6.5–8.1)

## 2023-03-09 LAB — CBC
HCT: 38.9 % (ref 36.0–46.0)
Hemoglobin: 13.1 g/dL (ref 12.0–15.0)
MCH: 30.5 pg (ref 26.0–34.0)
MCHC: 33.7 g/dL (ref 30.0–36.0)
MCV: 90.5 fL (ref 80.0–100.0)
Platelets: 261 10*3/uL (ref 150–400)
RBC: 4.3 MIL/uL (ref 3.87–5.11)
RDW: 12.4 % (ref 11.5–15.5)
WBC: 11.2 10*3/uL — ABNORMAL HIGH (ref 4.0–10.5)
nRBC: 0 % (ref 0.0–0.2)

## 2023-03-09 LAB — URINALYSIS, ROUTINE W REFLEX MICROSCOPIC
Bilirubin Urine: NEGATIVE
Glucose, UA: NEGATIVE mg/dL
Ketones, ur: NEGATIVE mg/dL
Nitrite: NEGATIVE
Protein, ur: NEGATIVE mg/dL
Specific Gravity, Urine: 1.015 (ref 1.005–1.030)
pH: 6 (ref 5.0–8.0)

## 2023-03-09 LAB — HCG, SERUM, QUALITATIVE: Preg, Serum: NEGATIVE

## 2023-03-09 LAB — LIPASE, BLOOD: Lipase: 44 U/L (ref 11–51)

## 2023-03-09 MED ORDER — IOHEXOL 300 MG/ML  SOLN
100.0000 mL | Freq: Once | INTRAMUSCULAR | Status: AC | PRN
  Administered 2023-03-09: 100 mL via INTRAVENOUS

## 2023-03-09 MED ORDER — SODIUM CHLORIDE 0.9 % IV BOLUS
1000.0000 mL | Freq: Once | INTRAVENOUS | Status: AC
  Administered 2023-03-09: 1000 mL via INTRAVENOUS

## 2023-03-09 MED ORDER — OXYCODONE HCL 5 MG PO TABS: 5.0000 mg | ORAL_TABLET | Freq: Four times a day (QID) | ORAL | 0 refills | Status: DC | PRN

## 2023-03-09 MED ORDER — SODIUM CHLORIDE (PF) 0.9 % IJ SOLN
INTRAMUSCULAR | Status: AC
  Filled 2023-03-09: qty 50

## 2023-03-09 MED ORDER — MORPHINE SULFATE (PF) 4 MG/ML IV SOLN
4.0000 mg | Freq: Once | INTRAVENOUS | Status: AC
  Administered 2023-03-09: 4 mg via INTRAVENOUS
  Filled 2023-03-09: qty 1

## 2023-03-09 MED ORDER — OXYCODONE HCL 5 MG PO TABS: 5.0000 mg | ORAL_TABLET | Freq: Four times a day (QID) | ORAL | 0 refills | Status: AC | PRN

## 2023-03-09 NOTE — ED Notes (Signed)
Patient was offered a beverage.

## 2023-03-09 NOTE — Discharge Instructions (Addendum)
Your abdominal pain is most likely due to a ruptured ovarian cyst. The pain should continue to improve over the next few days.  - I sent a prescription for oxycodone 5mg , you can take this as needed every 6 hours for pain control. - Please follow-up with your OBGYN to discuss options for treatment.

## 2023-03-09 NOTE — ED Triage Notes (Addendum)
Per EMS, Pt complaining of abd intermittent pain that radiates to her shoulder that began yesterday. Pt does endorse nausea. Hx of ovarian cyst, and torsion. Pt says that the pain yesterday felt like it was an ovarian cyst, but today feel much worse. 50 mcg of fentanyl given by EMS apprx 1 hr ago.

## 2023-03-09 NOTE — ED Provider Notes (Signed)
[place] EMERGENCY DEPARTMENT AT [facility] Provider Note   CSN: [id number] Arrival date & time: 03/09/23  1421     History  Chief Complaint  Patient presents with   Abdominal Pain    Sharon Roberts is a 27 y.o. female hx of L ovarian torsion 2022, anxiety, depression  Around 12 noon today, had sharp pain in center of abm, radiating to R shoulder. Taking breaths was exacerbating the pain. Feels like someone stabbed her. Felt hot and nauseous, but no vomiting. Denies diarrhea. Has hx of ovarian cyst ruptures and feels like this felt similar to that pain. Has mirena IUD and has very light periods ~1-2 times a year. Denies any vaginal discharge or bleeding. No dysuria. Thinks she may have had more urinary frequency for the past month.   EMS was called, they had her position to her L side and her pain improved before they gave her any meds. EMS gave fentanyl and brought her in. She last ate 3hrs prior to pain onset. In ED, pain was improving and felt more like "soreness". Pain is currently 6/10. Felt like the pain may have gotten worse when trying to move into a chair in ED.  Had a similar episode of pain in her abm yesterday. Reports that it felt like an ovarian cyst rupture yesterday, and then improved after , which is how her ovarian cysts ruptures normally feel.    Abdominal Pain Associated symptoms: no dysuria, no vaginal bleeding and no vaginal discharge       Home Medications Prior to Admission medications   Medication Sig Start Date End Date Taking? Authorizing Provider  oxyCODONE (OXY IR/ROXICODONE) 5 MG immediate release tablet Take 1 tablet (5 mg total) by mouth every 6 (six) hours as needed for severe pain. 03/09/23  Yes Lincoln Brigham, MD      Allergies    Amoxicillin, Cinnamon, and Penicillins    Review of Systems   Review of Systems  Gastrointestinal:  Positive for abdominal pain.  Genitourinary:  Negative for dysuria, flank pain, vaginal bleeding  and vaginal discharge.   Physical Exam Updated Vital Signs BP 109/80   Pulse 75   Temp 98.2 F (36.8 C) (Oral)   Resp 16   Ht 5\' 4"  (1.626 m)   Wt 87.5 kg   LMP  (LMP Unknown)   SpO2 98%   BMI 33.13 kg/m  Physical Exam Gen: Alert woman laying in bed. NAD. Speaking in full sentences.  HEENT: NCAT. MMM. CV: RRR, no murmurs. Resp: CTAB. Normal WOB on RA.  Abm: Tender to palpation in LLQ and RLQ (pt reports that she chronically is tender in those areas and is unsure if this is worse than baseline). Soft, nondistended. No rigidity, rebound, or guarding. No CVA tenderness. Skin: warm, well perfused.  ED Results / Procedures / Treatments   Labs (all labs ordered are listed, but only abnormal results are displayed) Labs Reviewed  COMPREHENSIVE METABOLIC PANEL - Abnormal; Notable for the following components:      Result Value   Potassium 3.2 (*)    Calcium 8.5 (*)    All other components within normal limits  CBC - Abnormal; Notable for the following components:   WBC 11.2 (*)    All other components within normal limits  URINALYSIS, ROUTINE W REFLEX MICROSCOPIC - Abnormal; Notable for the following components:   Hgb urine dipstick SMALL (*)    Leukocytes,Ua TRACE (*)    Bacteria, UA MANY (*)  All other components within normal limits  LIPASE, BLOOD  HCG, SERUM, QUALITATIVE    EKG None  Radiology CT ABDOMEN PELVIS W CONTRAST  Result Date: 03/09/2023 CLINICAL DATA:  Acute abdominal pain. EXAM: CT ABDOMEN AND PELVIS WITH CONTRAST TECHNIQUE: Multidetector CT imaging of the abdomen and pelvis was performed using the standard protocol following bolus administration of intravenous contrast. RADIATION DOSE REDUCTION: This exam was performed according to the departmental dose-optimization program which includes automated exposure control, adjustment of the mA and/or kV according to patient size and/or use of iterative reconstruction technique. CONTRAST:  OMNIPAQUE IOHEXOL 300  MG/ML  SOLN COMPARISON:  Pelvic ultrasound earlier today. FINDINGS: Lower chest: Clear lung bases. Hepatobiliary: No focal liver abnormality is seen. No gallstones, gallbladder wall thickening, or biliary dilatation. Small amount of free fluid adjacent to the inferior liver edge. Pancreas: Unremarkable. No pancreatic ductal dilatation or surrounding inflammatory changes. Spleen: Normal in size. Linear defect posteriorly spans approximately 4.5 cm cranial caudal dimension, 9 mm in greatest depth. Trace perisplenic free fluid. Adrenals/Urinary Tract: Normal adrenal glands. No hydronephrosis, renal calculi or focal renal abnormality. Urinary bladder is decompressed and not well assessed. Stomach/Bowel: Detailed bowel assessment is limited in the absence of enteric contrast. Normal appearance of the stomach. No small bowel obstruction or evidence of small bowel inflammation. Pelvic bowel loops are suboptimally assessed due to adjacent free fluid. Small-moderate volume of stool in the colon. Normal appendix, series 7, image 58. Vascular/Lymphatic: Normal caliber abdominal aorta. Retroaortic left renal vein. The hepatic and splenic arteries likely arise separately from the abdominal aorta, variant vascular anatomy. No bulky abdominopelvic adenopathy. Reproductive: Intrauterine device in the uterus is appropriately position by CT. The ovaries are not discretely identified. There is a small to moderate volume of free fluid in the pelvis, free fluid in the anterior right adnexa, mildly complex. Other: Pelvic free fluid tracking into the right anterior pelvis, mildly complex. There is trace free fluid adjacent to the liver and spleen, also mildly complex. No free air. Mild stranding of the pelvic fat in the right lower quadrant. Small fat containing umbilical hernia. Musculoskeletal: There are no acute or suspicious osseous abnormalities. Occasional bone islands in the pelvis. IMPRESSION: 1. Small to moderate volume of free  fluid in the pelvis, tracking into the right anterior pelvis, mildly complex. There is trace free fluid adjacent to the liver and spleen, also mildly complex. Findings may be due to ruptured hemorrhagic ovarian cyst. 2. Linear defect in the posterior spleen spanning approximately 4.5 cm cranial caudal dimension, 9 mm in greatest depth. This may represent a splenic cleft, however the possibility of splenic laceration is raised. There is no evidence of active extravasation, and free fluid does not appear to be centered on the spleen, favoring splenic cleft. 3. Small fat containing umbilical hernia. These results were called by telephone at the time of interpretation on 03/09/2023 at 9:14 pm to provider DAVID YAO , who verbally acknowledged these results. Electronically Signed   By: Narda Rutherford M.D.   On: 03/09/2023 21:14   US Pelvis Complete  Result Date: 03/09/2023 CLINICAL DATA:  History of torsion.  Pelvic pain. EXAM: TRANSABDOMINAL AND TRANSVAGINAL ULTRASOUND OF PELVIS DOPPLER ULTRASOUND OF OVARIES TECHNIQUE: Both transabdominal and transvaginal ultrasound examinations of the pelvis were performed. Transabdominal technique was performed for global imaging of the pelvis including uterus, ovaries, adnexal regions, and pelvic cul-de-sac. It was necessary to proceed with endovaginal exam following the transabdominal exam to visualize the uterus, endometrium, ovaries and  adnexa. Color and duplex Doppler ultrasound was utilized to evaluate blood flow to the ovaries. COMPARISON:  09/03/2020 FINDINGS: Uterus Measurements: 9.6 x 5.9 x 7.5 cm = volume: 222 mL. No fibroids or other mass visualized. Endometrium Thickness: 7 mm in thickness.  No focal abnormality visualized. Right ovary Measurements: Not visualized due to overlying bowel gas. No adnexal mass seen. Left ovary Measurements: Not visualized due to overlying bowel gas. No adnexal mass seen. Pulsed Doppler evaluation of both ovaries could not be performed due  to nonvisualization of the ovaries. Other findings Moderate free fluid. IMPRESSION: Neither ovary visualized due to overlying bowel gas. Moderate free fluid. Electronically Signed   By: Charlett Nose M.D.   On: 03/09/2023 20:26   US Transvaginal Non-OB  Result Date: 03/09/2023 CLINICAL DATA:  History of torsion.  Pelvic pain. EXAM: TRANSABDOMINAL AND TRANSVAGINAL ULTRASOUND OF PELVIS DOPPLER ULTRASOUND OF OVARIES TECHNIQUE: Both transabdominal and transvaginal ultrasound examinations of the pelvis were performed. Transabdominal technique was performed for global imaging of the pelvis including uterus, ovaries, adnexal regions, and pelvic cul-de-sac. It was necessary to proceed with endovaginal exam following the transabdominal exam to visualize the uterus, endometrium, ovaries and adnexa. Color and duplex Doppler ultrasound was utilized to evaluate blood flow to the ovaries. COMPARISON:  09/03/2020 FINDINGS: Uterus Measurements: 9.6 x 5.9 x 7.5 cm = volume: 222 mL. No fibroids or other mass visualized. Endometrium Thickness: 7 mm in thickness.  No focal abnormality visualized. Right ovary Measurements: Not visualized due to overlying bowel gas. No adnexal mass seen. Left ovary Measurements: Not visualized due to overlying bowel gas. No adnexal mass seen. Pulsed Doppler evaluation of both ovaries could not be performed due to nonvisualization of the ovaries. Other findings Moderate free fluid. IMPRESSION: Neither ovary visualized due to overlying bowel gas. Moderate free fluid. Electronically Signed   By: Charlett Nose M.D.   On: 03/09/2023 20:26   [place] Art/Ven Flow Abd Pelv Doppler  Result Date: 03/09/2023 CLINICAL DATA:  History of torsion.  Pelvic pain. EXAM: TRANSABDOMINAL AND TRANSVAGINAL ULTRASOUND OF PELVIS DOPPLER ULTRASOUND OF OVARIES TECHNIQUE: Both transabdominal and transvaginal ultrasound examinations of the pelvis were performed. Transabdominal technique was performed for global imaging of the pelvis  including uterus, ovaries, adnexal regions, and pelvic cul-de-sac. It was necessary to proceed with endovaginal exam following the transabdominal exam to visualize the uterus, endometrium, ovaries and adnexa. Color and duplex Doppler ultrasound was utilized to evaluate blood flow to the ovaries. COMPARISON:  09/03/2020 FINDINGS: Uterus Measurements: 9.6 x 5.9 x 7.5 cm = volume: 222 mL. No fibroids or other mass visualized. Endometrium Thickness: 7 mm in thickness.  No focal abnormality visualized. Right ovary Measurements: Not visualized due to overlying bowel gas. No adnexal mass seen. Left ovary Measurements: Not visualized due to overlying bowel gas. No adnexal mass seen. Pulsed Doppler evaluation of both ovaries could not be performed due to nonvisualization of the ovaries. Other findings Moderate free fluid. IMPRESSION: Neither ovary visualized due to overlying bowel gas. Moderate free fluid. Electronically Signed   By: Charlett Nose M.D.   On: 03/09/2023 20:26    Procedures Procedures    Medications Ordered in ED Medications  morphine (PF) 4 MG/ML injection 4 mg (4 mg Intravenous Given 03/09/23 1914)  sodium chloride 0.9 % bolus 1,000 mL (1,000 mLs Intravenous New Bag/Given 03/09/23 2149)  iohexol (OMNIPAQUE) 300 MG/ML solution 100 mL (100 mLs Intravenous Contrast Given 03/09/23 2041)    ED Course/ Medical Decision Making/ A&P  Medical Decision Making:   ALAZAE CRYMES is a 27 y.o. female who presented to the ED today with abdominal pain detailed above.     Complete initial physical exam performed, notably the patient  was tender to palpation in LLQ and RLQ, no peritoneal signs.    Reviewed and confirmed nursing documentation for past medical history, family history, social history.    Initial Assessment:   With the patient's presentation of abdominal pain, hx of ovarian cysts, and pain feeling similar to hx of ruptured ovarian cysts, most likely diagnosis is  ovarian cyst rupture. Other diagnoses were considered including (but not limited to) ovarian torsion, PID, ectopic pregnancy, nephrolithiasis, biliary colic, pancreatitis, appendicitis. These are considered less likely due to history of present illness and physical exam findings.   This is most consistent with an acute complicated illness  Initial Plan:  Upreg, pelvic US, transvaginal U/S, pelvic doppler, lipase 1L NS bolus Morphine 4mg  IV for pain control Screening labs including CBC and Metabolic panel to evaluate for infectious or metabolic etiology of disease.  Urinalysis with reflex culture ordered to evaluate for UTI or relevant urologic/nephrologic pathology.  Objective evaluation as below reviewed   Initial Study Results:   Laboratory  - Upreg negative  Radiology:  All images reviewed independently. Agree with radiology report at this time.   - U/S unable to visualize BL ovaries due to bowel gas. U/S demonstrated moderate free fluid. - CTAP showed small to moderate free fluid in pelvis concerning for hemorrhagic cyst rupture.  It also showed linear defect in spleen c/f spenic cleft vs laceration, however less likely laceration given lack of abm trauma and no LUQ pain.    CT ABDOMEN PELVIS W CONTRAST  Result Date: 03/09/2023 CLINICAL DATA:  Acute abdominal pain. EXAM: CT ABDOMEN AND PELVIS WITH CONTRAST TECHNIQUE: Multidetector CT imaging of the abdomen and pelvis was performed using the standard protocol following bolus administration of intravenous contrast. RADIATION DOSE REDUCTION: This exam was performed according to the departmental dose-optimization program which includes automated exposure control, adjustment of the mA and/or kV according to patient size and/or use of iterative reconstruction technique. CONTRAST:  OMNIPAQUE IOHEXOL 300 MG/ML  SOLN COMPARISON:  Pelvic ultrasound earlier today. FINDINGS: Lower chest: Clear lung bases. Hepatobiliary: No focal liver abnormality  is seen. No gallstones, gallbladder wall thickening, or biliary dilatation. Small amount of free fluid adjacent to the inferior liver edge. Pancreas: Unremarkable. No pancreatic ductal dilatation or surrounding inflammatory changes. Spleen: Normal in size. Linear defect posteriorly spans approximately 4.5 cm cranial caudal dimension, 9 mm in greatest depth. Trace perisplenic free fluid. Adrenals/Urinary Tract: Normal adrenal glands. No hydronephrosis, renal calculi or focal renal abnormality. Urinary bladder is decompressed and not well assessed. Stomach/Bowel: Detailed bowel assessment is limited in the absence of enteric contrast. Normal appearance of the stomach. No small bowel obstruction or evidence of small bowel inflammation. Pelvic bowel loops are suboptimally assessed due to adjacent free fluid. Small-moderate volume of stool in the colon. Normal appendix, series 7, image 58. Vascular/Lymphatic: Normal caliber abdominal aorta. Retroaortic left renal vein. The hepatic and splenic arteries likely arise separately from the abdominal aorta, variant vascular anatomy. No bulky abdominopelvic adenopathy. Reproductive: Intrauterine device in the uterus is appropriately position by CT. The ovaries are not discretely identified. There is a small to moderate volume of free fluid in the pelvis, free fluid in the anterior right adnexa, mildly complex. Other: Pelvic free fluid tracking into the right anterior pelvis, mildly complex. There  is trace free fluid adjacent to the liver and spleen, also mildly complex. No free air. Mild stranding of the pelvic fat in the right lower quadrant. Small fat containing umbilical hernia. Musculoskeletal: There are no acute or suspicious osseous abnormalities. Occasional bone islands in the pelvis. IMPRESSION: 1. Small to moderate volume of free fluid in the pelvis, tracking into the right anterior pelvis, mildly complex. There is trace free fluid adjacent to the liver and spleen, also  mildly complex. Findings may be due to ruptured hemorrhagic ovarian cyst. 2. Linear defect in the posterior spleen spanning approximately 4.5 cm cranial caudal dimension, 9 mm in greatest depth. This may represent a splenic cleft, however the possibility of splenic laceration is raised. There is no evidence of active extravasation, and free fluid does not appear to be centered on the spleen, favoring splenic cleft. 3. Small fat containing umbilical hernia. These results were called by telephone at the time of interpretation on 03/09/2023 at 9:14 pm to provider DAVID YAO , who verbally acknowledged these results. Electronically Signed   By: Narda Rutherford M.D.   On: 03/09/2023 21:14   US Pelvis Complete  Result Date: 03/09/2023 CLINICAL DATA:  History of torsion.  Pelvic pain. EXAM: TRANSABDOMINAL AND TRANSVAGINAL ULTRASOUND OF PELVIS DOPPLER ULTRASOUND OF OVARIES TECHNIQUE: Both transabdominal and transvaginal ultrasound examinations of the pelvis were performed. Transabdominal technique was performed for global imaging of the pelvis including uterus, ovaries, adnexal regions, and pelvic cul-de-sac. It was necessary to proceed with endovaginal exam following the transabdominal exam to visualize the uterus, endometrium, ovaries and adnexa. Color and duplex Doppler ultrasound was utilized to evaluate blood flow to the ovaries. COMPARISON:  09/03/2020 FINDINGS: Uterus Measurements: 9.6 x 5.9 x 7.5 cm = volume: 222 mL. No fibroids or other mass visualized. Endometrium Thickness: 7 mm in thickness.  No focal abnormality visualized. Right ovary Measurements: Not visualized due to overlying bowel gas. No adnexal mass seen. Left ovary Measurements: Not visualized due to overlying bowel gas. No adnexal mass seen. Pulsed Doppler evaluation of both ovaries could not be performed due to nonvisualization of the ovaries. Other findings Moderate free fluid. IMPRESSION: Neither ovary visualized due to overlying bowel gas.  Moderate free fluid. Electronically Signed   By: Charlett Nose M.D.   On: 03/09/2023 20:26   US Transvaginal Non-OB  Result Date: 03/09/2023 CLINICAL DATA:  History of torsion.  Pelvic pain. EXAM: TRANSABDOMINAL AND TRANSVAGINAL ULTRASOUND OF PELVIS DOPPLER ULTRASOUND OF OVARIES TECHNIQUE: Both transabdominal and transvaginal ultrasound examinations of the pelvis were performed. Transabdominal technique was performed for global imaging of the pelvis including uterus, ovaries, adnexal regions, and pelvic cul-de-sac. It was necessary to proceed with endovaginal exam following the transabdominal exam to visualize the uterus, endometrium, ovaries and adnexa. Color and duplex Doppler ultrasound was utilized to evaluate blood flow to the ovaries. COMPARISON:  09/03/2020 FINDINGS: Uterus Measurements: 9.6 x 5.9 x 7.5 cm = volume: 222 mL. No fibroids or other mass visualized. Endometrium Thickness: 7 mm in thickness.  No focal abnormality visualized. Right ovary Measurements: Not visualized due to overlying bowel gas. No adnexal mass seen. Left ovary Measurements: Not visualized due to overlying bowel gas. No adnexal mass seen. Pulsed Doppler evaluation of both ovaries could not be performed due to nonvisualization of the ovaries. Other findings Moderate free fluid. IMPRESSION: Neither ovary visualized due to overlying bowel gas. Moderate free fluid. Electronically Signed   By: Charlett Nose M.D.   On: 03/09/2023 20:26   [place] Art/Ven Flow  Abd Pelv Doppler  Result Date: 03/09/2023 CLINICAL DATA:  History of torsion.  Pelvic pain. EXAM: TRANSABDOMINAL AND TRANSVAGINAL ULTRASOUND OF PELVIS DOPPLER ULTRASOUND OF OVARIES TECHNIQUE: Both transabdominal and transvaginal ultrasound examinations of the pelvis were performed. Transabdominal technique was performed for global imaging of the pelvis including uterus, ovaries, adnexal regions, and pelvic cul-de-sac. It was necessary to proceed with endovaginal exam following the  transabdominal exam to visualize the uterus, endometrium, ovaries and adnexa. Color and duplex Doppler ultrasound was utilized to evaluate blood flow to the ovaries. COMPARISON:  09/03/2020 FINDINGS: Uterus Measurements: 9.6 x 5.9 x 7.5 cm = volume: 222 mL. No fibroids or other mass visualized. Endometrium Thickness: 7 mm in thickness.  No focal abnormality visualized. Right ovary Measurements: Not visualized due to overlying bowel gas. No adnexal mass seen. Left ovary Measurements: Not visualized due to overlying bowel gas. No adnexal mass seen. Pulsed Doppler evaluation of both ovaries could not be performed due to nonvisualization of the ovaries. Other findings Moderate free fluid. IMPRESSION: Neither ovary visualized due to overlying bowel gas. Moderate free fluid. Electronically Signed   By: Charlett Nose M.D.   On: 03/09/2023 20:26     Reassessment and Plan:   - Given evidence of free fluid in pelvis on imaging studies, subjective reporting this pain feels similar to past ovarian cyst rupture, and pain improving, pt's presentation is most consistent w/ ruptured ovarian cyst. - Discharged pt w/ oxy 5mg  x10 tabs for pain control. Advised to take q6h prn for pain. - Advised patient to follow-up with OB/GYN   Final Clinical Impression(s) / ED Diagnoses Final diagnoses:  Ruptured ovarian cyst    Rx / DC Orders ED Discharge Orders          Ordered    oxyCODONE (OXY IR/ROXICODONE) 5 MG immediate release tablet  Every 6 hours PRN        03/09/23 2208              Lincoln Brigham, MD 03/09/23 2216    Charlynne Pander, MD 03/09/23 2314

## 2023-05-14 ENCOUNTER — Ambulatory Visit
Admission: RE | Admit: 2023-05-14 | Discharge: 2023-05-14 | Disposition: A | Discharge status: Home / Self Care | Payer: Medicaid Other | Source: Ambulatory Visit | Attending: Internal Medicine | Admitting: Internal Medicine

## 2023-05-14 ENCOUNTER — Other Ambulatory Visit: Payer: Self-pay

## 2023-05-14 VITALS — BP 101/69 | HR 85 | Temp 98.9°F | Resp 18

## 2023-05-14 DIAGNOSIS — J029 Acute pharyngitis, unspecified: Secondary | ICD-10-CM | POA: Diagnosis not present

## 2023-05-14 DIAGNOSIS — J069 Acute upper respiratory infection, unspecified: Secondary | ICD-10-CM | POA: Diagnosis not present

## 2023-05-14 DIAGNOSIS — Z1152 Encounter for screening for COVID-19: Secondary | ICD-10-CM | POA: Insufficient documentation

## 2023-05-14 LAB — SARS CORONAVIRUS 2 BY RT PCR: SARS Coronavirus 2 by RT PCR: NEGATIVE

## 2023-05-14 MED ORDER — PROMETHAZINE-DM 6.25-15 MG/5ML PO SYRP: 5.0000 mL | ORAL_SOLUTION | Freq: Four times a day (QID) | ORAL | 0 refills | Status: AC | PRN

## 2023-05-14 MED ORDER — PREDNISONE 10 MG PO TABS: 20.0000 mg | ORAL_TABLET | Freq: Every day | ORAL | 0 refills | Status: AC

## 2023-05-14 NOTE — Discharge Instructions (Addendum)
Symptoms most consistent with a viral upper respiratory infection. Will treat with the following: You have been swabbed for COVID, and the test will result in the next 24 hours. Our staff will call you if positive. If the COVID test is positive, you should quarantine until you are fever free for 24 hours and you are starting to feel better, and then take added precautions for the next 5 days, such as physical distancing/wearing a mask and good hand hygiene/washing.   Prednisone 2 tablets in the morning with breakfast for 5 days. Do not take ibuprofen or aleve with this medication. Ok to use tylenol.  Promethazine-DM 5 mls every 6 hours as needed for cough. This medication can cause drowsiness so use caution with driving.  Rest and stay hydrated Return to urgent care or PCP if symptoms worsen or fail to resolve.

## 2023-05-14 NOTE — ED Triage Notes (Signed)
Sore throat since Friday- reports since then has had cough, congestion, fatigue, "easily exerted". She has used her inhaler with no relief as well as OTC meds and cough drops. Pain in her chest with cough. She tried to go to her PCP Friday and today but no appts were available

## 2023-05-14 NOTE — ED Provider Notes (Addendum)
[facility]    CSN: [id number] Arrival date & time: 05/14/23  1102      History   Chief Complaint Chief Complaint  Patient presents with   Cough    Short winded. Cough. Chest hurts when coughing. Congested. Voice goes in and out. - Entered by patient   Sore Throat    HPI Sharon Roberts is a 27 y.o. female.   27 year old female who presents to urgent care with complaints of cough, sore throat, congestion, chest tightness and soreness with deep breaths, body aches, fatigue with any activity that is very bothersome that have been going on since Friday last week. She thought she had strep but throat is less sore now. Ear pain on the right for 2 weeks. Denies abd pain, urinary symptoms, poor appetite, headache. No known sick contacts but has 3 kids that are in school.    Cough Associated symptoms: chest pain, ear pain and sore throat (Mild)   Associated symptoms: no chills, no fever, no headaches, no rash and no shortness of breath   Sore Throat Associated symptoms include chest pain. Pertinent negatives include no abdominal pain, no headaches and no shortness of breath.    Past Medical History:  Diagnosis Date   Anemia    Anemia 09/03/2020   Anxiety    Asthma    Depression    Left ovarian cyst 09/03/2020   Torsion of left ovary, ovarian pedicle and fallopian tube 09/03/2020    There are no problems to display for this patient.   Past Surgical History:  Procedure Laterality Date   LAPAROSCOPY N/A 09/03/2020   Procedure: LAPAROSCOPY DIAGNOSTIC  OVARIAN  LEFT CYSTECTOMY and DETORSION OF OVARY;  Surgeon: Tereso Newcomer, MD;  Location: [facility] OR;  Service: Gynecology;  Laterality: N/A;   NO PAST SURGERIES     OVARY SURGERY      OB History     Gravida  3   Para  3   Term  3   Preterm      AB      Living  3      SAB      IAB      Ectopic      Multiple  0   Live Births  3            Home Medications    Prior to Admission  medications   Medication Sig Start Date End Date Taking? Authorizing Provider  albuterol (VENTOLIN HFA) 108 (90 Base) MCG/ACT inhaler Inhale 2 puffs into the lungs every 6 (six) hours as needed. 03/11/21  Yes [provider]  oxyCODONE (OXY IR/ROXICODONE) 5 MG immediate release tablet Take 1 tablet (5 mg total) by mouth every 6 (six) hours as needed for severe pain. 03/09/23   Lincoln Brigham, MD    Family History Family History  Problem Relation Age of Onset   Neuropathy Mother    COPD Mother    Fibromyalgia Mother    Hypertension Father    Healthy Sister    Healthy Brother    Healthy Sister     Social History Social History   Tobacco Use   Smoking status: Never   Smokeless tobacco: Never  Vaping Use   Vaping status: Never Used  Substance Use Topics   Alcohol use: No   Drug use: No     Allergies   Amoxicillin, Cinnamon, and Penicillins   Review of Systems Review of Systems  Constitutional:  Positive for fatigue. Negative  for appetite change, chills and fever.  HENT:  Positive for congestion (running nose), ear pain and sore throat (Mild).   Eyes:  Negative for pain and visual disturbance.  Respiratory:  Positive for cough and chest tightness. Negative for shortness of breath.   Cardiovascular:  Positive for chest pain. Negative for palpitations.  Gastrointestinal:  Negative for abdominal pain, constipation, diarrhea, nausea and vomiting.  Genitourinary:  Negative for dysuria and hematuria.  Musculoskeletal:  Negative for arthralgias and back pain.  Skin:  Negative for color change and rash.  Neurological:  Negative for seizures, syncope and headaches.  All other systems reviewed and are negative.    Physical Exam Triage Vital Signs ED Triage Vitals  Encounter Vitals Group     BP 05/14/23 1127 101/69     Systolic BP Percentile --      Diastolic BP Percentile --      Pulse Rate 05/14/23 1127 85     Resp 05/14/23 1127 18     Temp 05/14/23 1127 98.9 F  (37.2 C)     Temp Source 05/14/23 1127 Oral     SpO2 05/14/23 1127 96 %     Weight --      Height --      Head Circumference --      Peak Flow --      Pain Score 05/14/23 1121 7     Pain Loc --      Pain Education --      Exclude from Growth Chart --    No data found.  Updated Vital Signs BP 101/69 (BP Location: Right Arm)   Pulse 85   Temp 98.9 F (37.2 C) (Oral)   Resp 18   SpO2 96%   Breastfeeding No   Visual Acuity Right Eye Distance:   Left Eye Distance:   Bilateral Distance:    Right Eye Near:   Left Eye Near:    Bilateral Near:     Physical Exam Vitals and nursing note reviewed.  Constitutional:      General: She is not in acute distress.    Appearance: She is well-developed.  HENT:     Head: Normocephalic and atraumatic.     Right Ear: No drainage. Tympanic membrane is not injected, perforated or erythematous.     Left Ear: Tympanic membrane normal.     Ears:      Mouth/Throat:     Mouth: Mucous membranes are moist.     Pharynx: Oropharynx is clear. No posterior oropharyngeal erythema.     Tonsils: No tonsillar exudate or tonsillar abscesses.  Eyes:     Conjunctiva/sclera: Conjunctivae normal.  Cardiovascular:     Rate and Rhythm: Normal rate and regular rhythm.     Heart sounds: No murmur heard. Pulmonary:     Effort: Pulmonary effort is normal. No respiratory distress.     Breath sounds: Normal breath sounds.  Abdominal:     Palpations: Abdomen is soft.     Tenderness: There is no abdominal tenderness.  Musculoskeletal:        General: No swelling.     Cervical back: Neck supple.  Skin:    General: Skin is warm and dry.     Capillary Refill: Capillary refill takes less than 2 seconds.  Neurological:     Mental Status: She is alert.  Psychiatric:        Mood and Affect: Mood normal.      UC Treatments / Results  Labs (all labs  ordered are listed, but only abnormal results are displayed) Labs Reviewed - No data to  display  EKG   Radiology No results found.  Procedures Procedures (including critical care time)  Medications Ordered in UC Medications - No data to display  Initial Impression / Assessment and Plan / UC Course  I have reviewed the triage vital signs and the nursing notes.  Pertinent labs & imaging results that were available during my care of the patient were reviewed by me and considered in my medical decision making (see chart for details).     Sore throat - Plan: SARS Coronavirus 2 by RT PCR (hospital order, performed in [facility] hospital lab) *cepheid single result test* Anterior Nasal Swab, Airborne and Contact precautions, SARS Coronavirus 2 by RT PCR (hospital order, performed in [facility] hospital lab) *cepheid single result test* Anterior Nasal Swab, Airborne and Contact precautions  Viral upper respiratory infection   Symptoms most consistent with a viral upper respiratory infection. Will treat with the following: You have been swabbed for COVID, and the test will result in the next 24 hours. Our staff will call you if positive. If the COVID test is positive, you should quarantine until you are fever free for 24 hours and you are starting to feel better, and then take added precautions for the next 5 days, such as physical distancing/wearing a mask and good hand hygiene/washing.   Prednisone 2 tablets in the morning with breakfast for 5 days. Do not take ibuprofen or aleve with this medication. Ok to use tylenol.  Promethazine-DM 5 mls every 6 hours as needed for cough. This medication can cause drowsiness so use caution with driving.  Rest and stay hydrated Return to urgent care or PCP if symptoms worsen or fail to resolve.   Final Clinical Impressions(s) / UC Diagnoses   Final diagnoses:  None   Discharge Instructions   None    ED Prescriptions   None    PDMP not reviewed this encounter.   Landis Martins, PA-C 05/14/23 1203    Landis Martins, [place] 05/14/23 1205

## 2023-07-26 ENCOUNTER — Ambulatory Visit: Payer: Self-pay

## 2023-10-19 ENCOUNTER — Ambulatory Visit
Admission: RE | Admit: 2023-10-19 | Discharge: 2023-10-19 | Disposition: A | Discharge status: Home / Self Care | Source: Ambulatory Visit

## 2023-10-19 ENCOUNTER — Encounter: Payer: Self-pay | Admitting: Oncology

## 2023-10-19 VITALS — BP 101/69 | HR 98 | Temp 98.2°F | Resp 18

## 2023-10-19 DIAGNOSIS — J028 Acute pharyngitis due to other specified organisms: Secondary | ICD-10-CM | POA: Diagnosis not present

## 2023-10-19 DIAGNOSIS — J029 Acute pharyngitis, unspecified: Secondary | ICD-10-CM

## 2023-10-19 DIAGNOSIS — B9789 Other viral agents as the cause of diseases classified elsewhere: Secondary | ICD-10-CM | POA: Insufficient documentation

## 2023-10-19 LAB — POCT RAPID STREP A (OFFICE): Rapid Strep A Screen: NEGATIVE

## 2023-10-19 NOTE — ED Provider Notes (Signed)
 [facility]    CSN: [id number] Arrival date & time: 10/19/23  1841      History   Chief Complaint Chief Complaint  Patient presents with   Sore Throat    Dryness and pain on one side of throat - Entered by patient    HPI Sharon Roberts is a 28 y.o. female.   Patient presents to urgent care for evaluation of sore throat that started this morning approximately 2:30 AM.  Reports associated nasal congestion and minimal cough that she describes as more of a throat clearing cough sensation.  Denies nausea, vomiting, diarrhea, abdominal pain, rash, dizziness, fever, chills, body aches, and changes in voice sounds.  Sore throat is currently a 3 on a scale of 0-10 and worsened by swallowing.  No recent antibiotic or steroid use reported.  She took ibuprofen 200 mg today and this helped significantly with sore throat.    Sore Throat    Past Medical History:  Diagnosis Date   Anemia    Anemia 09/03/2020   Anxiety    Asthma    Depression    Left ovarian cyst 09/03/2020   Torsion of left ovary, ovarian pedicle and fallopian tube 09/03/2020    Patient Active Problem List   Diagnosis Date Noted   Anxiety and depression 01/22/2023    Past Surgical History:  Procedure Laterality Date   LAPAROSCOPY N/A 09/03/2020   Procedure: LAPAROSCOPY DIAGNOSTIC  OVARIAN  LEFT CYSTECTOMY and DETORSION OF OVARY;  Surgeon: Tereso Newcomer, MD;  Location: [facility] OR;  Service: Gynecology;  Laterality: N/A;   NO PAST SURGERIES     OVARY SURGERY      OB History     Gravida  3   Para  3   Term  3   Preterm      AB      Living  3      SAB      IAB      Ectopic      Multiple  0   Live Births  3            Home Medications    Prior to Admission medications   Medication Sig Start Date End Date Taking? Authorizing Provider  levonorgestrel (MIRENA) 20 MCG/DAY IUD by Intrauterine route.   Yes [provider]  albuterol (VENTOLIN HFA) 108 (90 Base) MCG/ACT  inhaler Inhale 2 puffs into the lungs every 6 (six) hours as needed. 03/11/21   [provider]  busPIRone (BUSPAR) 10 MG tablet Take 1 tablet by mouth 2 (two) times daily. Patient not taking: Reported on 10/19/2023 02/12/23 02/12/24  [provider]  oxyCODONE (OXY IR/ROXICODONE) 5 MG immediate release tablet Take 1 tablet (5 mg total) by mouth every 6 (six) hours as needed for severe pain. Patient not taking: Reported on 10/19/2023 03/09/23   Lincoln Brigham, MD  promethazine-dextromethorphan (PROMETHAZINE-DM) 6.25-15 MG/5ML syrup Take 5 mLs by mouth every 6 (six) hours as needed for cough. Patient not taking: Reported on 10/19/2023 05/14/23   Landis Martins, PA-C  saccharomyces boulardii (FLORASTOR) 250 MG capsule Take by mouth. Patient not taking: Reported on 10/19/2023    [provider]  triamcinolone cream (KENALOG) 0.1 % Apply topically 2 (two) times daily. Patient not taking: Reported on 10/19/2023 01/22/23 01/22/24  [provider]    Family History Family History  Problem Relation Age of Onset   Neuropathy Mother    COPD Mother    Fibromyalgia Mother  Hypertension Father    Healthy Sister    Healthy Brother    Healthy Sister     Social History Social History   Tobacco Use   Smoking status: Never   Smokeless tobacco: Never  Vaping Use   Vaping status: Never Used  Substance Use Topics   Alcohol use: No   Drug use: No     Allergies   Amoxicillin, Cinnamon, and Penicillins   Review of Systems Review of Systems Per HPI  Physical Exam Triage Vital Signs ED Triage Vitals  Encounter Vitals Group     BP 10/19/23 1859 101/69     Systolic BP Percentile --      Diastolic BP Percentile --      Pulse Rate 10/19/23 1859 98     Resp 10/19/23 1859 18     Temp 10/19/23 1859 98.2 F (36.8 C)     Temp Source 10/19/23 1859 Oral     SpO2 10/19/23 1859 97 %     Weight --      Height --      Head Circumference --      Peak Flow --      Pain  Score 10/19/23 1900 3     Pain Loc --      Pain Education --      Exclude from Growth Chart --    No data found.  Updated Vital Signs BP 101/69 (BP Location: Left Arm)   Pulse 98   Temp 98.2 F (36.8 C) (Oral)   Resp 18   LMP  (LMP Unknown)   SpO2 97%   Visual Acuity Right Eye Distance:   Left Eye Distance:   Bilateral Distance:    Right Eye Near:   Left Eye Near:    Bilateral Near:     Physical Exam Vitals and nursing note reviewed.  Constitutional:      Appearance: She is not ill-appearing or toxic-appearing.  HENT:     Head: Normocephalic and atraumatic.     Right Ear: Hearing, tympanic membrane, ear canal and external ear normal.     Left Ear: Hearing, tympanic membrane, ear canal and external ear normal.     Nose: Nose normal.     Mouth/Throat:     Lips: Pink.     Mouth: Mucous membranes are moist. No injury or oral lesions.     Dentition: Normal dentition.     Tongue: No lesions.     Pharynx: Oropharynx is clear. Uvula midline. Posterior oropharyngeal erythema present. No pharyngeal swelling, oropharyngeal exudate, uvula swelling or postnasal drip.     Tonsils: No tonsillar exudate.     Comments: Mild erythema to the bilateral tonsillar pillars.  No tonsillar swelling appreciable on exam currently. Minimal post-nasal clear drainage to the posterior oropharynx. No trismus, phonation normal, maintaining secretions without difficulty.  Eyes:     General: Lids are normal. Vision grossly intact. Gaze aligned appropriately.     Extraocular Movements: Extraocular movements intact.     Conjunctiva/sclera: Conjunctivae normal.  Neck:     Trachea: Trachea and phonation normal.  Cardiovascular:     Rate and Rhythm: Normal rate and regular rhythm.     Heart sounds: Normal heart sounds, S1 normal and S2 normal.  Pulmonary:     Effort: Pulmonary effort is normal. No respiratory distress.     Breath sounds: Normal breath sounds and air entry.  Musculoskeletal:      Cervical back: Neck supple.  Skin:    General:  Skin is warm and dry.     Capillary Refill: Capillary refill takes less than 2 seconds.     Findings: No rash.  Neurological:     General: No focal deficit present.     Mental Status: She is alert and oriented to person, place, and time. Mental status is at baseline.     Cranial Nerves: No dysarthria or facial asymmetry.  Psychiatric:        Mood and Affect: Mood normal.        Speech: Speech normal.        Behavior: Behavior normal.        Thought Content: Thought content normal.        Judgment: Judgment normal.      UC Treatments / Results  Labs (all labs ordered are listed, but only abnormal results are displayed) Labs Reviewed  POCT RAPID STREP A (OFFICE) - Normal  CULTURE, GROUP A STREP [facility])    EKG   Radiology No results found.  Procedures Procedures (including critical care time)  Medications Ordered in UC Medications - No data to display  Initial Impression / Assessment and Plan / UC Course  I have reviewed the triage vital signs and the nursing notes.  Pertinent labs & imaging results that were available during my care of the patient were reviewed by me and considered in my medical decision making (see chart for details).   1. Viral pharyngitis Evaluation suggests viral pharyngitis etiology.   Group A strep POC testing negative, throat culture pending, will treat based on throat culture results for bacterial pharyngitis if indicated.  Low suspicion for mononucleosis, epiglottitis, peritonsillar abscess, etc.  HEENT exam stable without red flag signs. Will manage this conservatively with supportive care as outlined in AVS.  Salt water gargles as needed, warm water with honey.    Counseled patient on potential for adverse effects with medications prescribed/recommended today, strict ER and return-to-clinic precautions discussed, patient verbalized understanding.    Final Clinical Impressions(s) / UC  Diagnoses   Final diagnoses:  Viral pharyngitis     Discharge Instructions      Strep test in the clinic is negative, staff will call you if the throat culture is positive for bacteria in the next 2-3 days and call in treatment if necessary based on result.   For now, we will treat this as a viral infection with the following: - Over the counter medicines as needed for pain and swelling of the throat (Ibuprofen 600mg  and/or Tylenol 1,000mg  every 6 hours as needed) - 1 tablespoon of honey in warm water and/or salt water gargles every 3-4 hours  Seek medical care if you develop changes to your voice, inability to swallow, drooling, or any new symptoms. If symptoms are severe, please go to the ER. I hope you feel better!      ED Prescriptions   None    PDMP not reviewed this encounter.   Carlisle Beers, [place] 10/19/23 1932

## 2023-10-19 NOTE — ED Triage Notes (Addendum)
 Pt reports sore throat that started at 0230 AM. Soreness and extreme dryness. Pain is focused on R side of throat.  No other cold symptoms aside from sinus congestion. No fevers, chills, cough, or body aches. Denies N/V and diarrhea. Took ibuprofen 200mg  this afternoon with moderate relief.

## 2023-10-19 NOTE — Discharge Instructions (Signed)
 Strep test in the clinic is negative, staff will call you if the throat culture is positive for bacteria in the next 2-3 days and call in treatment if necessary based on result.   For now, we will treat this as a viral infection with the following: - Over the counter medicines as needed for pain and swelling of the throat (Ibuprofen 600mg  and/or Tylenol 1,000mg  every 6 hours as needed) - 1 tablespoon of honey in warm water and/or salt water gargles every 3-4 hours  Seek medical care if you develop changes to your voice, inability to swallow, drooling, or any new symptoms. If symptoms are severe, please go to the ER. I hope you feel better!

## 2023-10-21 LAB — CULTURE, GROUP A STREP (THRC)
